# Patient Record
Sex: Female | Born: 2000 | Race: Black or African American | Hispanic: No | Marital: Single | Smoking: Never smoker
Health system: Southern US, Community
[De-identification: ages and names within clinical notes are randomized; demographics above are authoritative.]

## PROBLEM LIST (undated history)

## (undated) ENCOUNTER — Inpatient Hospital Stay (HOSPITAL_COMMUNITY): Payer: Self-pay

## (undated) DIAGNOSIS — T7840XA Allergy, unspecified, initial encounter: Secondary | ICD-10-CM

## (undated) DIAGNOSIS — Z634 Disappearance and death of family member: Secondary | ICD-10-CM

## (undated) DIAGNOSIS — J45909 Unspecified asthma, uncomplicated: Secondary | ICD-10-CM

## (undated) DIAGNOSIS — R7303 Prediabetes: Secondary | ICD-10-CM

## (undated) DIAGNOSIS — R42 Dizziness and giddiness: Secondary | ICD-10-CM

## (undated) DIAGNOSIS — E669 Obesity, unspecified: Secondary | ICD-10-CM

## (undated) DIAGNOSIS — K5900 Constipation, unspecified: Secondary | ICD-10-CM

## (undated) DIAGNOSIS — M419 Scoliosis, unspecified: Secondary | ICD-10-CM

## (undated) HISTORY — DX: Constipation, unspecified: K59.00

## (undated) HISTORY — DX: Disappearance and death of family member: Z63.4

## (undated) HISTORY — DX: Allergy, unspecified, initial encounter: T78.40XA

## (undated) HISTORY — DX: Obesity, unspecified: E66.9

## (undated) HISTORY — DX: Dizziness and giddiness: R42

## (undated) HISTORY — PX: NO PAST SURGERIES: SHX2092

---
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
## 2021-02-02 ENCOUNTER — Ambulatory Visit (INDEPENDENT_AMBULATORY_CARE_PROVIDER_SITE_OTHER): Payer: Medicaid Other | Admitting: Pediatrics

## 2021-02-02 DIAGNOSIS — Z87898 Personal history of other specified conditions: Secondary | ICD-10-CM | POA: Insufficient documentation

## 2021-02-02 DIAGNOSIS — E559 Vitamin D deficiency, unspecified: Secondary | ICD-10-CM

## 2021-02-02 HISTORY — DX: Vitamin D deficiency, unspecified: E55.9

## 2021-02-02 NOTE — Patient Instructions (Incomplete)

## 2021-02-02 NOTE — Progress Notes (Deleted)
Subjective:  Subjective  Patient Name: Lori Briggs Date of Birth: 06/30/2000  MRN: [id number]  Lori Briggs  presents for follow-up of obesity, insulin resistance, history of elevated HbA1c, and vitamin D deficiency.  HISTORY OF PRESENT ILLNESS:   Lori Briggs is a 21 y.o. female presenting for follow-up of the above concerns.  she attended this visit alone.***  1. Lori Briggs was initially referred to [facility] in 05/2014 for concerns of obesity and elevated A1c. Her A1c has ranged from 5.3% to 5.6% since.  There is a strong family history of T2DM in mother.    2. Since last visit on 06/30/20, she has been well***.  Weight has ***creased ***lb since last visit.  BMI now ***%.   A1c is ***% today (was 5.3% at last visit).   Diet changes: ***  Diet Review: Breakfast- *** Midmorning snack- *** Lunch- *** Afternoon snack- *** Dinner- *** Bedtime snack- *** Drinks ***  Activity: ***    Vitamin D deficiency: Taking Vit D supplementation ***5000 units daily Most recent vit D level 22 in 06/2020 Sun exposure: *** Milk/dairy consumption: ***  ROS: All systems reviewed with pertinent positives listed below; otherwise negative.   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 30 tablet 5   ergocalciferol (VITAMIN D2) 1.25 MG (50000 UT) capsule Take 1 capsule (50,000 Units total) by mouth once a week. 12 capsule 0   fluticasone (FLONASE) 50 MCG/ACT nasal spray Place 2 sprays into both nostrils daily. 16 g 1   SUMAtriptan (IMITREX) 50 MG  tablet Take 1 tablet (50 mg total) by mouth every 2 (two) hours as needed for migraine. May repeat in 2 hours if headache persists or recurs. Max of 2 tablets in a 24 hour period. 10 tablet 0   No current facility-administered medications on file prior to visit.   Allergies: Allergies  Allergen Reactions   Banana Swelling and Rash   Penicillins Rash   Hospitalizations/Surgeries: No past surgical history on file.  No recent surgeries or hospitalizations  Family History  Problem Relation Age of Onset   Diabetes Mother    Hyperlipidemia Mother    Diabetes Maternal Grandmother    Diabetes Maternal Grandfather    Heart disease Maternal Grandfather    Hypertension Maternal Grandfather    Diabetes Paternal Grandmother    Healthy Father    Anemia Sister    Brain cancer Brother        disseminated glioma in leptomeninges   Seizures Brother        had cystic lesions develop after age 54 , poss neurcystercosis-unconfirmed   Hydrocephalus Brother        acquired v/p shunt age 23   Diabetes Maternal Aunt    Social History: Completed 12th grade  Attends [employer] 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   Ht Readings from Last 3  Encounters:  08/09/20 5\' 1"  (1.549 m) (10 %, Z= -1.28)*  06/30/20 5' 1.02" (1.55 m) (10 %, Z= -1.27)*  12/10/19 5\' 1"  (1.549 m) (10 %, Z= -1.26)*   * Growth percentiles are based on CDC (Girls, 2-20 Years) data.   Wt Readings from Last 3 Encounters:  08/09/20 201 lb (91.2 kg) (98 %, Z= 1.98)*  06/30/20 202 lb 6.4 oz (91.8 kg) (98 %, Z= 2.00)*  02/25/20 195 lb 12.8 oz (88.8 kg) (97 %, Z= 1.92)*   * Growth percentiles are based on CDC (Girls, 2-20 Years) data.   There is no height or weight on file to calculate BSA. No height on file for this encounter. No weight on file for this encounter.   General: Well developed, well nourished ***female in no acute  distress.  Appears *** stated age Head: Normocephalic, atraumatic.   Eyes:  Pupils equal and round. EOMI.   Sclera white.  No eye drainage.   Ears/Nose/Mouth/Throat: Masked Neck: supple, no cervical lymphadenopathy, no thyromegaly Cardiovascular: regular rate, normal S1/S2, no murmurs Respiratory: No increased work of breathing.  Lungs clear to auscultation bilaterally.  No wheezes. Abdomen: soft, nontender, nondistended.  Extremities: warm, well perfused, cap refill < 2 sec.   Musculoskeletal: Normal muscle mass.  Normal strength Skin: warm, dry.  No rash or lesions. Neurologic: alert and oriented, normal speech, no tremor   LAB DATA:   Latest Reference Range & Units 06/30/20 15:55  Total CHOL/HDL Ratio <5.0 (calc) 3.3  Cholesterol <170 mg/dL 161184 (H)  HDL Cholesterol >45 mg/dL 56  LDL Cholesterol (Calc) <110 mg/dL (calc) 096114 (H)  Non-HDL Cholesterol (Calc) <120 mg/dL (calc) 045128 (H)  Triglycerides <90 mg/dL 56  Vitamin D, 40-[id number]-Hydroxy 30 - 100 ng/mL 22 (L)  TSH mIU/L 2.63  T4,Free(Direct) 0.8 - 1.4 ng/dL 0.9  (H): Data is abnormally high (L): Data is abnormally low  Results for orders placed or performed in visit on 06/30/20  TSH  Result Value Ref Range   TSH 2.63 mIU/L  T4, free  Result Value Ref Range   Free T4 0.9 0.8 - 1.4 ng/dL  VITAMIN D 25 Hydroxy (Vit-D Deficiency, Fractures)  Result Value Ref Range   Vit D, 25-Hydroxy 22 (L) 30 - 100 ng/mL  Lipid panel  Result Value Ref Range   Cholesterol 184 (H) <170 mg/dL   HDL 56 >78>45 mg/dL   Triglycerides 56 <29<90 mg/dL   LDL Cholesterol (Calc) 114 (H) <110 mg/dL (calc)   Total CHOL/HDL Ratio 3.3 <5.0 (calc)   Non-HDL Cholesterol (Calc) 128 (H) <120 mg/dL (calc)  POCT glycosylated hemoglobin (Hb A1C)  Result Value Ref Range   Hemoglobin A1C 5.3 4.0 - 5.6 %   HbA1c POC (<> result, manual entry)     HbA1c, POC (prediabetic range)     HbA1c, POC (controlled diabetic range)    POCT Glucose (Device for Home Use)  Result Value  Ref Range   Glucose Fasting, POC     POC Glucose 92 70 - 99 mg/dl   ASSESSMENT and PLAN:  Lori Briggs is a 21 y.o. female with history of elevated A1c in the setting of family history of T2DM.  A1c is ***today.  She has had ***weight gain since last visit and BMI is now above the curve.  She also has a history of vitamin D deficiency and is taking daily supplementation. ***  1. History of prediabetes 2. BMI 95-99th% -POC A1c and glucose as above -Growth chart reviewed with patient -  Encouraged healthy eating (cutting back on eating out).  Commended on only drinking SF drinks -Encouraged increased physical activity -Will draw TSH, FT4, lipid panel today.  3. Vitamin D deficiency -Will draw 25-OH D level today -Continue current vit D supplement pending labs  Follow-up: No follow-ups on file.  ***  Casimiro Needle, MD

## 2021-04-19 ENCOUNTER — Emergency Department (HOSPITAL_COMMUNITY)
Admission: EM | Admit: 2021-04-19 | Discharge: 2021-04-20 | Disposition: A | Discharge status: Home / Self Care | Payer: Medicaid Other | Attending: Emergency Medicine | Admitting: Emergency Medicine

## 2021-04-19 ENCOUNTER — Emergency Department (HOSPITAL_COMMUNITY): Payer: Medicaid Other

## 2021-04-19 ENCOUNTER — Encounter (HOSPITAL_COMMUNITY): Payer: Self-pay | Admitting: *Deleted

## 2021-04-19 DIAGNOSIS — R0789 Other chest pain: Secondary | ICD-10-CM | POA: Diagnosis not present

## 2021-04-19 DIAGNOSIS — G4489 Other headache syndrome: Secondary | ICD-10-CM | POA: Diagnosis not present

## 2021-04-19 DIAGNOSIS — R52 Pain, unspecified: Secondary | ICD-10-CM | POA: Diagnosis not present

## 2021-04-19 DIAGNOSIS — M79605 Pain in left leg: Secondary | ICD-10-CM | POA: Insufficient documentation

## 2021-04-19 DIAGNOSIS — Z041 Encounter for examination and observation following transport accident: Secondary | ICD-10-CM | POA: Diagnosis not present

## 2021-04-19 DIAGNOSIS — R519 Headache, unspecified: Secondary | ICD-10-CM | POA: Diagnosis not present

## 2021-04-19 DIAGNOSIS — M542 Cervicalgia: Secondary | ICD-10-CM | POA: Diagnosis not present

## 2021-04-19 DIAGNOSIS — R109 Unspecified abdominal pain: Secondary | ICD-10-CM | POA: Insufficient documentation

## 2021-04-19 DIAGNOSIS — R188 Other ascites: Secondary | ICD-10-CM | POA: Diagnosis not present

## 2021-04-19 DIAGNOSIS — M79604 Pain in right leg: Secondary | ICD-10-CM | POA: Insufficient documentation

## 2021-04-19 DIAGNOSIS — Z79899 Other long term (current) drug therapy: Secondary | ICD-10-CM | POA: Insufficient documentation

## 2021-04-19 DIAGNOSIS — Y92410 Unspecified street and highway as the place of occurrence of the external cause: Secondary | ICD-10-CM | POA: Insufficient documentation

## 2021-04-19 DIAGNOSIS — S3991XA Unspecified injury of abdomen, initial encounter: Secondary | ICD-10-CM | POA: Diagnosis not present

## 2021-04-19 LAB — I-STAT CHEM 8, ED
BUN: 11 mg/dL (ref 6–20)
Calcium, Ion: 1.19 mmol/L (ref 1.15–1.40)
Chloride: 104 mmol/L (ref 98–111)
Creatinine, Ser: 0.7 mg/dL (ref 0.44–1.00)
Glucose, Bld: 93 mg/dL (ref 70–99)
HCT: 38 % (ref 36.0–46.0)
Hemoglobin: 12.9 g/dL (ref 12.0–15.0)
Potassium: 3.4 mmol/L — ABNORMAL LOW (ref 3.5–5.1)
Sodium: 140 mmol/L (ref 135–145)
TCO2: 27 mmol/L (ref 22–32)

## 2021-04-19 LAB — I-STAT BETA HCG BLOOD, ED (MC, WL, AP ONLY): I-stat hCG, quantitative: <5 m[IU]/mL (ref <5)

## 2021-04-19 LAB — URINALYSIS, ROUTINE W REFLEX MICROSCOPIC
Bacteria, UA: NONE SEEN
Bilirubin Urine: NEGATIVE
Glucose, UA: NEGATIVE mg/dL
Ketones, ur: NEGATIVE mg/dL
Leukocytes,Ua: NEGATIVE
Nitrite: NEGATIVE
Protein, ur: NEGATIVE mg/dL
Specific Gravity, Urine: 1.01 (ref 1.005–1.030)
pH: 6 (ref 5.0–8.0)

## 2021-04-19 MED ORDER — IOHEXOL 300 MG/ML  SOLN
100.0000 mL | Freq: Once | INTRAMUSCULAR | Status: AC | PRN
  Administered 2021-04-19: 80 mL via INTRAVENOUS

## 2021-04-19 MED ORDER — IBUPROFEN 400 MG PO TABS
600.0000 mg | ORAL_TABLET | Freq: Once | ORAL | Status: AC
  Administered 2021-04-19: 600 mg via ORAL
  Filled 2021-04-19: qty 2
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
## 2021-04-19 NOTE — ED Notes (Signed)
Patient transported to CT 
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
## 2021-04-20 MED ORDER — NAPROXEN 375 MG PO TABS: 375.0000 mg | ORAL_TABLET | Freq: Two times a day (BID) | ORAL | 0 refills | Status: DC

## 2021-04-20 MED ORDER — CYCLOBENZAPRINE HCL 10 MG PO TABS: 10.0000 mg | ORAL_TABLET | Freq: Two times a day (BID) | ORAL | 0 refills | Status: DC | PRN
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
## 2021-04-21 ENCOUNTER — Telehealth: Payer: Self-pay

## 2021-04-21 NOTE — Telephone Encounter (Signed)
Transition Care Management Follow-up Telephone Call ?Date of discharge and from where: 04/20/2021-[place]  ?How have you been since you were released from the hospital? Pt stated she is still sore but she is ok.  ?Any questions or concerns? No ? ?Items Reviewed: ?Did the pt receive and understand the discharge instructions provided? Yes  ?Medications obtained and verified? Yes  ?Other? No  ?Any new allergies since your discharge? No  ?Dietary orders reviewed? No ?Do you have support at home? Yes  ? ?Home Care and Equipment/Supplies: ?Were home health services ordered? not applicable ?If so, what is the name of the agency? N/A  ?Has the agency set up a time to come to the patient's home? not applicable ?Were any new equipment or medical supplies ordered?  No ?What is the name of the medical supply agency? N/A ?Were you able to get the supplies/equipment? not applicable ?Do you have any questions related to the use of the equipment or supplies? No ? ?Functional Questionnaire: (I = Independent and D = Dependent) ?ADLs: I ? ?Bathing/Dressing- I ? ?Meal Prep- I ? ?Eating- I ? ?Maintaining continence- I ? ?Transferring/Ambulation- I ? ?Managing Meds- I ? ?Follow up appointments reviewed: ? ?PCP Hospital f/u appt confirmed? Yes  Scheduled to see PCP Office on 04/27/2021. ?Specialist Hospital f/u appt confirmed? No   ?Are transportation arrangements needed? No  ?If their condition worsens, is the pt aware to call PCP or go to the Emergency Dept.? Yes ?Was the patient provided with contact information for the PCP's office or ED? Yes ?Was to pt encouraged to call back with questions or concerns? Yes  ?

## 2021-04-27 ENCOUNTER — Encounter: Payer: Self-pay | Admitting: Family Medicine

## 2021-04-27 ENCOUNTER — Ambulatory Visit (INDEPENDENT_AMBULATORY_CARE_PROVIDER_SITE_OTHER): Payer: Medicaid Other | Admitting: Family Medicine

## 2021-04-27 DIAGNOSIS — R42 Dizziness and giddiness: Secondary | ICD-10-CM | POA: Diagnosis not present

## 2021-04-27 DIAGNOSIS — E161 Other hypoglycemia: Secondary | ICD-10-CM

## 2021-04-27 DIAGNOSIS — N3090 Cystitis, unspecified without hematuria: Secondary | ICD-10-CM

## 2021-04-27 DIAGNOSIS — Z1159 Encounter for screening for other viral diseases: Secondary | ICD-10-CM

## 2021-04-27 DIAGNOSIS — R7301 Impaired fasting glucose: Secondary | ICD-10-CM | POA: Diagnosis not present

## 2021-04-27 DIAGNOSIS — E559 Vitamin D deficiency, unspecified: Secondary | ICD-10-CM | POA: Diagnosis not present

## 2021-04-27 DIAGNOSIS — R7303 Prediabetes: Secondary | ICD-10-CM

## 2021-04-27 DIAGNOSIS — Z118 Encounter for screening for other infectious and parasitic diseases: Secondary | ICD-10-CM | POA: Diagnosis not present

## 2021-04-27 HISTORY — DX: Dizziness and giddiness: R42

## 2021-04-27 MED ORDER — MECLIZINE HCL 25 MG PO TABS
25.0000 mg | ORAL_TABLET | Freq: Three times a day (TID) | ORAL | 0 refills | Status: DC | PRN
Start: 2021-04-27 — End: 2021-10-28

## 2021-04-27 NOTE — Assessment & Plan Note (Addendum)
-   Labs and imaging reviewed ?-Continue taking flexeril and Naproxen for pain and muscle spasm ?- Encouraged to increase fiber and fluid intake for constipation ?-Recommended OTC miralax if needed ?
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
## 2021-04-27 NOTE — Patient Instructions (Addendum)
I appreciate the opportunity to provide care to you today! ?  ?-Please stop by the lab to have your labs drawn anytime this week. ? ?-I have sent you a prescription for meclizine to help with your dizziness. ? ?-Continue taking Flexeril and naproxen for pain and muscle spasms for your neck and back pain. ? ? ? ? ?  ?It was a pleasure to see you and I look forward to continuing to work together on your health and well-being. ?Please do not hesitate to call the office if you need care or have questions about your care. ?  ?Have a wonderful day and week. ?With Gratitude, ?Gilmore Laroche MSN, FNP-BC ? ?

## 2021-04-27 NOTE — Assessment & Plan Note (Signed)
-   Informed patient to take meclizine for positional dizziness ?

## 2021-04-27 NOTE — Assessment & Plan Note (Addendum)
Lab Results  ?Component Value Date  ? HGBA1C 5.3 06/30/2020  ? ?-Pending results for HgA1c today ?

## 2021-04-29 LAB — CHLAMYDIA/GC NAA, CONFIRMATION
Chlamydia trachomatis, NAA: NEGATIVE
Neisseria gonorrhoeae, NAA: NEGATIVE

## 2021-04-29 NOTE — Progress Notes (Signed)
Please inform the patient her results were negative for chlamydia and gonorrhea

## 2021-05-12 ENCOUNTER — Encounter: Payer: Self-pay | Admitting: *Deleted

## 2021-05-20 DIAGNOSIS — E161 Other hypoglycemia: Secondary | ICD-10-CM | POA: Diagnosis not present

## 2021-05-20 DIAGNOSIS — E559 Vitamin D deficiency, unspecified: Secondary | ICD-10-CM | POA: Diagnosis not present

## 2021-05-20 DIAGNOSIS — Z1159 Encounter for screening for other viral diseases: Secondary | ICD-10-CM | POA: Diagnosis not present

## 2021-05-22 LAB — CBC WITH DIFFERENTIAL/PLATELET
Basophils Absolute: 0 10*3/uL (ref 0.0–0.2)
Basos: 1 %
EOS (ABSOLUTE): 0 10*3/uL (ref 0.0–0.4)
Eos: 1 %
Hematocrit: 36.2 % (ref 34.0–46.6)
Hemoglobin: 12.1 g/dL (ref 11.1–15.9)
Immature Grans (Abs): 0 10*3/uL (ref 0.0–0.1)
Immature Granulocytes: 0 %
Lymphocytes Absolute: 1.4 10*3/uL (ref 0.7–3.1)
Lymphs: 36 %
MCH: 28.3 pg (ref 26.6–33.0)
MCHC: 33.4 g/dL (ref 31.5–35.7)
MCV: 85 fL (ref 79–97)
Monocytes Absolute: 0.3 10*3/uL (ref 0.1–0.9)
Monocytes: 7 %
Neutrophils Absolute: 2.1 10*3/uL (ref 1.4–7.0)
Neutrophils: 55 %
Platelets: 270 10*3/uL (ref 150–450)
RBC: 4.28 x10E6/uL (ref 3.77–5.28)
RDW: 14 % (ref 11.7–15.4)
WBC: 3.8 10*3/uL (ref 3.4–10.8)

## 2021-05-22 LAB — HEMOGLOBIN A1C
Est. average glucose Bld gHb Est-mCnc: 111 mg/dL
Hgb A1c MFr Bld: 5.5 % (ref 4.8–5.6)

## 2021-05-22 LAB — CMP14+EGFR
ALT: 13 IU/L (ref 0–32)
AST: 14 IU/L (ref 0–40)
Albumin/Globulin Ratio: 2.3 — ABNORMAL HIGH (ref 1.2–2.2)
Albumin: 4.5 g/dL (ref 3.9–5.0)
Alkaline Phosphatase: 67 IU/L (ref 42–106)
BUN/Creatinine Ratio: 14 (ref 9–23)
BUN: 10 mg/dL (ref 6–20)
Bilirubin Total: 0.3 mg/dL (ref 0.0–1.2)
CO2: 24 mmol/L (ref 20–29)
Calcium: 9.4 mg/dL (ref 8.7–10.2)
Chloride: 102 mmol/L (ref 96–106)
Creatinine, Ser: 0.69 mg/dL (ref 0.57–1.00)
Globulin, Total: 2 g/dL (ref 1.5–4.5)
Glucose: 76 mg/dL (ref 70–99)
Potassium: 4.1 mmol/L (ref 3.5–5.2)
Sodium: 138 mmol/L (ref 134–144)
Total Protein: 6.5 g/dL (ref 6.0–8.5)
eGFR: 128 mL/min/{1.73_m2} (ref >59)

## 2021-05-22 LAB — LIPID PANEL
Chol/HDL Ratio: 3.5 ratio (ref 0.0–4.4)
Cholesterol, Total: 180 mg/dL — ABNORMAL HIGH (ref 100–169)
HDL: 52 mg/dL (ref >39)
LDL Chol Calc (NIH): 120 mg/dL — ABNORMAL HIGH (ref 0–109)
Triglycerides: 41 mg/dL (ref 0–89)
VLDL Cholesterol Cal: 8 mg/dL (ref 5–40)

## 2021-05-22 LAB — HEPATITIS C ANTIBODY: Hep C Virus Ab: NONREACTIVE

## 2021-05-22 LAB — VITAMIN D 25 HYDROXY (VIT D DEFICIENCY, FRACTURES): Vit D, 25-Hydroxy: 13 ng/mL — ABNORMAL LOW (ref 30.0–100.0)

## 2021-05-22 LAB — TSH+FREE T4
Free T4: 1.09 ng/dL (ref 0.93–1.60)
TSH: 1.89 u[IU]/mL (ref 0.450–4.500)

## 2021-05-22 LAB — MICROALBUMIN / CREATININE URINE RATIO
Creatinine, Urine: 166.2 mg/dL
Microalb/Creat Ratio: 5 mg/g creat (ref 0–29)
Microalbumin, Urine: 8 ug/mL

## 2021-05-23 ENCOUNTER — Other Ambulatory Visit: Payer: Self-pay | Admitting: Family Medicine

## 2021-05-23 DIAGNOSIS — E559 Vitamin D deficiency, unspecified: Secondary | ICD-10-CM

## 2021-05-23 MED ORDER — ERGOCALCIFEROL 1.25 MG (50000 UT) PO CAPS: 50000.0000 [IU] | ORAL_CAPSULE | ORAL | 0 refills | Status: DC

## 2021-05-23 NOTE — Progress Notes (Signed)
Advised the patient to take vit D supplements once weekly because her vit D is low. The order is placed and sent to her pharmacy.  ? ?I also recommend a cholesterol-lowering diet low in fat or saturated fat because her cholesterol were slightly elevated. I would advise her to implement the Mediterranean diet, which emphasizes fruits, vegetables, whole grains, beans, nuts, seeds, and healthy fats. ? ?All other labs are within normal limits ?

## 2021-07-22 ENCOUNTER — Ambulatory Visit (INDEPENDENT_AMBULATORY_CARE_PROVIDER_SITE_OTHER): Payer: Medicaid Other | Admitting: Family Medicine

## 2021-07-22 ENCOUNTER — Encounter: Payer: Self-pay | Admitting: Family Medicine

## 2021-07-22 VITALS — BP 105/61 | HR 74 | Ht 61.0 in | Wt 196.6 lb

## 2021-07-22 DIAGNOSIS — G8929 Other chronic pain: Secondary | ICD-10-CM

## 2021-07-22 DIAGNOSIS — R42 Dizziness and giddiness: Secondary | ICD-10-CM | POA: Diagnosis not present

## 2021-07-22 DIAGNOSIS — M545 Low back pain, unspecified: Secondary | ICD-10-CM | POA: Diagnosis not present

## 2021-07-22 HISTORY — DX: Low back pain, unspecified: M54.50

## 2021-07-22 NOTE — Assessment & Plan Note (Signed)
Resolved No complaints were noted today

## 2021-07-22 NOTE — Patient Instructions (Addendum)
I appreciate the opportunity to provide care to you today!    Follow up:  3 months CPE   Tiger balm  to apply to the lower back   Please continue to a heart-healthy diet and increase your physical activities. Try to exercise for at least three times a week.      It was a pleasure to see you and I look forward to continuing to work together on your health and well-being. Please do not hesitate to call the office if you need care or have questions about your care.   Have a wonderful day and week. With Gratitude, Gilmore Laroche MSN, FNP-BC
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
## 2021-07-27 ENCOUNTER — Ambulatory Visit: Payer: Medicaid Other | Admitting: Family Medicine

## 2021-08-10 ENCOUNTER — Other Ambulatory Visit: Payer: Self-pay | Admitting: Family Medicine

## 2021-08-10 DIAGNOSIS — E559 Vitamin D deficiency, unspecified: Secondary | ICD-10-CM

## 2021-10-28 ENCOUNTER — Encounter: Payer: Self-pay | Admitting: Family Medicine

## 2021-10-28 ENCOUNTER — Ambulatory Visit (INDEPENDENT_AMBULATORY_CARE_PROVIDER_SITE_OTHER): Payer: Medicaid Other | Admitting: Family Medicine

## 2021-10-28 VITALS — BP 114/71 | HR 71 | Resp 16 | Ht 61.0 in | Wt 188.1 lb

## 2021-10-28 DIAGNOSIS — E559 Vitamin D deficiency, unspecified: Secondary | ICD-10-CM | POA: Diagnosis not present

## 2021-10-28 DIAGNOSIS — Z0001 Encounter for general adult medical examination with abnormal findings: Secondary | ICD-10-CM

## 2021-10-28 DIAGNOSIS — E038 Other specified hypothyroidism: Secondary | ICD-10-CM | POA: Diagnosis not present

## 2021-10-28 DIAGNOSIS — J301 Allergic rhinitis due to pollen: Secondary | ICD-10-CM

## 2021-10-28 DIAGNOSIS — R7301 Impaired fasting glucose: Secondary | ICD-10-CM | POA: Diagnosis not present

## 2021-10-28 DIAGNOSIS — Z114 Encounter for screening for human immunodeficiency virus [HIV]: Secondary | ICD-10-CM | POA: Diagnosis not present

## 2021-10-28 DIAGNOSIS — E7849 Other hyperlipidemia: Secondary | ICD-10-CM | POA: Diagnosis not present

## 2021-10-28 MED ORDER — FLUTICASONE PROPIONATE 50 MCG/ACT NA SUSP: 2.0000 | Freq: Every day | NASAL | 2 refills | Status: DC

## 2021-10-28 MED ORDER — CETIRIZINE HCL 10 MG PO TABS: 10.0000 mg | ORAL_TABLET | Freq: Every day | ORAL | 5 refills | Status: DC
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
## 2021-10-28 NOTE — Assessment & Plan Note (Addendum)
Physical exam as documented Counseling is done on healthy lifestyle involving commitment to 150 minutes of exercise per week,  Discussed heart-healthy diet Immunization and cancer screening needs are specifically addressed at this visit She declines a flu vaccination today Will get basic labs today

## 2021-10-28 NOTE — Patient Instructions (Signed)
I appreciate the opportunity to provide care to you today!    Follow up:  6 months  Labs: please stop by the lab during the week to get your blood drawn (CBC, CMP, TSH, Lipid profile, HgA1c, Vit D)  Please pick up your medications at the pharmacy  Please continue to a heart-healthy diet and increase your physical activities. Try to exercise for 56mins at least three times a week.      It was a pleasure to see you and I look forward to continuing to work together on your health and well-being. Please do not hesitate to call the office if you need care or have questions about your care.   Have a wonderful day and week. With Gratitude, Alvira Monday MSN, FNP-BC

## 2021-10-28 NOTE — Assessment & Plan Note (Signed)
Refilled Flonase and Zyrtec. 

## 2021-11-04 NOTE — Addendum Note (Signed)
Addended byAlvira Monday on: 11/04/2021 05:29 PM   Modules accepted: Level of Service

## 2022-03-10 DIAGNOSIS — H5213 Myopia, bilateral: Secondary | ICD-10-CM | POA: Diagnosis not present

## 2022-03-20 ENCOUNTER — Ambulatory Visit (INDEPENDENT_AMBULATORY_CARE_PROVIDER_SITE_OTHER): Payer: Medicaid Other | Admitting: Family Medicine

## 2022-03-20 ENCOUNTER — Encounter: Payer: Self-pay | Admitting: Family Medicine

## 2022-03-20 VITALS — BP 133/88 | HR 71 | Ht 61.0 in | Wt 178.0 lb

## 2022-03-20 DIAGNOSIS — N920 Excessive and frequent menstruation with regular cycle: Secondary | ICD-10-CM

## 2022-03-20 DIAGNOSIS — G43829 Menstrual migraine, not intractable, without status migrainosus: Secondary | ICD-10-CM

## 2022-03-20 MED ORDER — NORETHINDRONE 0.35 MG PO TABS: 1.0000 | ORAL_TABLET | Freq: Every day | ORAL | 1 refills | Status: DC

## 2022-03-20 MED ORDER — SUMATRIPTAN SUCCINATE 25 MG PO TABS: 25.0000 mg | ORAL_TABLET | ORAL | 0 refills | Status: DC | PRN

## 2022-03-20 NOTE — Patient Instructions (Addendum)
I appreciate the opportunity to provide care to you today!    Follow up:  the end of April 2024  Labs:  (CBC)  Menstrual Migraine Will start you on sumatriptan 25 mg to take as needed for migraine headaches Your migraine is likely associated with alterations in your estrogen levels during your menstrual cycle. Will reevaluate your symptoms at the end of the month in April If your symptoms are persistent, will will consider starting you on a combined oral contraceptive     Please continue to a heart-healthy diet and increase your physical activities. Try to exercise for 5mns at least five times a week.      It was a pleasure to see you and I look forward to continuing to work together on your health and well-being. Please do not hesitate to call the office if you need care or have questions about your care.   Have a wonderful day and week. With Gratitude, GAlvira MondayMSN, FNP-BC

## 2022-03-20 NOTE — Progress Notes (Signed)
Established Patient Office Visit  Subjective:  Patient ID: Lori Briggs, female    DOB: 2000-07-29  Age: 22 y.o. MRN: [id number]  CC:  Chief Complaint  Patient presents with   Menstrual Problem    Pt reports heavy flow with menstrual cycles since 09/09/2021, has migraines that are severe cause her to vomit. Lmp was 03/12/22, causing her to miss work due to heavy flow and mitraine sx.     HPI Lori Briggs is a 22 y.o. female with past medical history of menstrual migraines, dizziness presents for f/u of  chronic medical conditions. For the details of today's visit, please refer to the assessment and plan.     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 No pertinent surgical history.  Family History  Problem Relation Age of Onset   Diabetes Mother    Hyperlipidemia Mother    Diabetes Maternal Grandmother    Diabetes Maternal Grandfather    Heart disease Maternal Grandfather    Hypertension Maternal Grandfather    Diabetes Paternal Grandmother    Healthy Father    Anemia Sister    Brain cancer Brother        disseminated glioma in leptomeninges   Seizures Brother        had cystic lesions develop after age 381 , poss neurcystercosis-unconfirmed   Hydrocephalus Brother        acquired v/p shunt age 79   Diabetes Maternal Aunt     Social History   Socioeconomic History   Marital status: Single    Spouse name: Not on file   Number of children: Not on file   Years of education: Not on file   Highest education level: Not on file  Occupational History   Occupation: [employer]    Comment: Actuary- trains all positions  Tobacco Use   Smoking status: Never   Smokeless tobacco: Never  Substance and Sexual  Activity   Alcohol use: No   Drug use: No   Sexual activity: Yes    Birth control/protection: Condom  Other Topics Concern   Not on file  Social History Narrative   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 30 tablet 5   fluticasone (FLONASE) 50 MCG/ACT nasal spray Place 2 sprays into both nostrils daily. 16 g 2   Vitamin D, Ergocalciferol, (DRISDOL) 1.25 MG (50000 UNIT) CAPS capsule Take 1 capsule by mouth once a week 12 capsule 0   No facility-administered medications prior to visit.    Allergies  Allergen Reactions   Banana Swelling and Rash   Penicillins Rash    ROS Review of Systems  Constitutional:  Negative  for chills and fever.  Eyes:  Negative for visual disturbance.  Respiratory:  Negative for chest tightness and shortness of breath.   Genitourinary:  Positive for menstrual problem.  Neurological:  Positive for headaches. Negative for dizziness.      Objective:    Physical Exam HENT:     Head: Normocephalic.     Mouth/Throat:     Mouth: Mucous membranes are moist.  Cardiovascular:     Rate and Rhythm: Normal rate.     Heart sounds: Normal heart sounds.  Pulmonary:     Effort: Pulmonary effort is normal.     Breath sounds: Normal breath sounds.  Neurological:     Mental Status: She is alert.     BP 133/88   Pulse 71   Ht '5\' 1"'$  (1.549 m)   Wt 178 lb (80.7 kg)   SpO2 98%   BMI 33.63 kg/m  Wt Readings from Last 3 Encounters:  03/20/22 178 lb (80.7 kg)  10/28/21 188 lb 1.9 oz (85.3 kg) (96 %, Z=  1.74)*  07/22/21 196 lb 9.6 oz (89.2 kg) (97 %, Z= 1.89)*   * Growth percentiles are based on CDC (Girls, 2-20 Years) data.    Lab Results  Component Value Date   TSH 1.890 05/20/2021   Lab Results  Component Value Date   WBC 3.8 05/20/2021   HGB 12.1 05/20/2021   HCT 36.2 05/20/2021   MCV 85 05/20/2021   PLT 270 05/20/2021   Lab Results  Component Value Date   NA 138 05/20/2021   K 4.1 05/20/2021   CO2 24 05/20/2021   GLUCOSE 76 05/20/2021   BUN 10 05/20/2021   CREATININE 0.69 05/20/2021   BILITOT 0.3 05/20/2021   ALKPHOS 67 05/20/2021   AST 14 05/20/2021   ALT 13 05/20/2021   PROT 6.5 05/20/2021   ALBUMIN 4.5 05/20/2021   CALCIUM 9.4 05/20/2021   ANIONGAP 6 07/17/2017   EGFR 128 05/20/2021   Lab Results  Component Value Date   CHOL 180 (H) 05/20/2021   Lab Results  Component Value Date   HDL 52 05/20/2021   Lab Results  Component Value Date   LDLCALC 120 (H) 05/20/2021   Lab Results  Component Value Date   TRIG 41 05/20/2021   Lab Results  Component Value Date   CHOLHDL 3.5 05/20/2021   Lab Results  Component Value Date   HGBA1C 5.5 05/20/2021      Assessment & Plan:  Menstrual migraine without status migrainosus, not intractable Assessment & Plan: Recurrent condition She was seen for similar complaints on 08/09/2020 and was started on Triptans for her menstrual headaches Patient reports that she has not been taking triptans She complains of migraine headaches that occur during her menstrual cycles She reports heavy menstrual bleeding, noting to change her pads every 2 hours She complains of severe migraine headaches for the duration of her menstrual cycle Last menstrual period was 03/12/2022 Will get a transvaginal ultrasound to rule out fibroids Will reinstate triptan therapy today If triptan is ineffective, we will consider starting a patient on oral combined contraceptives   Orders: -     SUMAtriptan Succinate; Take 1 tablet (25 mg  total) by mouth every 2 (two) hours as needed for migraine. Max: 200 mg PO daily  Dispense: 10 tablet; Refill: 0  Menorrhagia with regular cycle -     US PELVIC COMPLETE WITH TRANSVAGINAL -     CBC    Follow-up: Return in about 7  weeks (around 05/05/2022).   Alvira Monday, FNP

## 2022-03-20 NOTE — Assessment & Plan Note (Signed)
Recurrent condition She was seen for similar complaints on 08/09/2020 and was started on Triptans for her menstrual headaches Patient reports that she has not been taking triptans She complains of migraine headaches that occur during her menstrual cycles She reports heavy menstrual bleeding, noting to change her pads every 2 hours She complains of severe migraine headaches for the duration of her menstrual cycle Last menstrual period was 03/12/2022 Will get a transvaginal ultrasound to rule out fibroids Will reinstate triptan therapy today If triptan is ineffective, we will consider starting a patient on oral combined contraceptives

## 2022-03-21 LAB — CBC
Hematocrit: 36.2 % (ref 34.0–46.6)
Hemoglobin: 12 g/dL (ref 11.1–15.9)
MCH: 27.6 pg (ref 26.6–33.0)
MCHC: 33.1 g/dL (ref 31.5–35.7)
MCV: 83 fL (ref 79–97)
Platelets: 238 10*3/uL (ref 150–450)
RBC: 4.35 x10E6/uL (ref 3.77–5.28)
RDW: 12.9 % (ref 11.7–15.4)
WBC: 4.1 10*3/uL (ref 3.4–10.8)

## 2022-03-27 ENCOUNTER — Ambulatory Visit (HOSPITAL_COMMUNITY)
Admission: RE | Admit: 2022-03-27 | Discharge: 2022-03-27 | Disposition: A | Discharge status: Home / Self Care | Payer: Medicaid Other | Source: Ambulatory Visit | Attending: Family Medicine | Admitting: Family Medicine

## 2022-03-27 DIAGNOSIS — N920 Excessive and frequent menstruation with regular cycle: Secondary | ICD-10-CM | POA: Insufficient documentation

## 2022-03-29 ENCOUNTER — Telehealth: Payer: Self-pay | Admitting: Family Medicine

## 2022-03-29 NOTE — Telephone Encounter (Signed)
Patient calling about ultrasound results. Seen in mychart and needs it explained.

## 2022-03-31 ENCOUNTER — Telehealth: Payer: Self-pay | Admitting: Family Medicine

## 2022-03-31 ENCOUNTER — Other Ambulatory Visit: Payer: Self-pay | Admitting: Family Medicine

## 2022-03-31 DIAGNOSIS — N83201 Unspecified ovarian cyst, right side: Secondary | ICD-10-CM

## 2022-03-31 NOTE — Telephone Encounter (Signed)
Called and reviewed results with the patient

## 2022-03-31 NOTE — Telephone Encounter (Signed)
I called and reviewed the result of the pelvic ultrasound. I informed the patient that no fibroids or masses were noted. She does have a hemorrhagic cyst in her right ovary, with the recommendation to follow up with an ultrasound in 6 to 12 weeks to assess this resolution. Orders are placed for pelvic u/s in 6 weeks

## 2022-05-04 ENCOUNTER — Ambulatory Visit (HOSPITAL_COMMUNITY)
Admission: RE | Admit: 2022-05-04 | Discharge: 2022-05-04 | Disposition: A | Discharge status: Home / Self Care | Payer: Medicaid Other | Source: Ambulatory Visit | Attending: Family Medicine | Admitting: Family Medicine

## 2022-05-04 DIAGNOSIS — N83201 Unspecified ovarian cyst, right side: Secondary | ICD-10-CM | POA: Insufficient documentation

## 2022-05-05 ENCOUNTER — Ambulatory Visit (INDEPENDENT_AMBULATORY_CARE_PROVIDER_SITE_OTHER): Payer: Medicaid Other | Admitting: Family Medicine

## 2022-05-05 ENCOUNTER — Encounter: Payer: Self-pay | Admitting: Family Medicine

## 2022-05-05 VITALS — BP 124/85 | HR 85 | Ht 60.5 in | Wt 170.1 lb

## 2022-05-05 DIAGNOSIS — E7849 Other hyperlipidemia: Secondary | ICD-10-CM

## 2022-05-05 DIAGNOSIS — Z114 Encounter for screening for human immunodeficiency virus [HIV]: Secondary | ICD-10-CM

## 2022-05-05 DIAGNOSIS — R7301 Impaired fasting glucose: Secondary | ICD-10-CM

## 2022-05-05 DIAGNOSIS — E038 Other specified hypothyroidism: Secondary | ICD-10-CM

## 2022-05-05 DIAGNOSIS — Z118 Encounter for screening for other infectious and parasitic diseases: Secondary | ICD-10-CM

## 2022-05-05 DIAGNOSIS — E559 Vitamin D deficiency, unspecified: Secondary | ICD-10-CM | POA: Diagnosis not present

## 2022-05-05 DIAGNOSIS — J301 Allergic rhinitis due to pollen: Secondary | ICD-10-CM | POA: Diagnosis not present

## 2022-05-05 MED ORDER — VITAMIN D (ERGOCALCIFEROL) 1.25 MG (50000 UNIT) PO CAPS: 50000.0000 [IU] | ORAL_CAPSULE | ORAL | 0 refills | Status: DC

## 2022-05-05 MED ORDER — CETIRIZINE HCL 10 MG PO TABS
10.0000 mg | ORAL_TABLET | Freq: Every day | ORAL | 5 refills | Status: AC
Start: 2022-05-05 — End: ?

## 2022-05-05 MED ORDER — FLUTICASONE PROPIONATE 50 MCG/ACT NA SUSP
2.0000 | Freq: Every day | NASAL | 2 refills | Status: AC
Start: 2022-05-05 — End: ?

## 2022-05-05 NOTE — Assessment & Plan Note (Signed)
Reports compliance with her weekly vitamin D supplement Refill sent to the pharmacy

## 2022-05-05 NOTE — Progress Notes (Signed)
Established Patient Office Visit  Subjective:  Patient ID: Lori Briggs, female    DOB: 2000/08/23  Age: 22 y.o. MRN: [id number]  CC:  Chief Complaint  Patient presents with   Chronic Care Management    Pt following up for 6 month visit.     HPI Lori Briggs is a 21 y.o. female with past medical history of allergic rhinitis, and vitamin D deficiency presents for f/u of  chronic medical conditions. For the details of today's visit, please refer to the assessment and plan.     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 No pertinent surgical history.  Family History  Problem Relation Age of Onset   Diabetes Mother    Hyperlipidemia Mother    Diabetes Maternal Grandmother    Diabetes Maternal Grandfather    Heart disease Maternal Grandfather    Hypertension Maternal Grandfather    Diabetes Paternal Grandmother    Healthy Father    Anemia Sister    Brain cancer Brother        disseminated glioma in leptomeninges   Seizures Brother        had cystic lesions develop after age 61 , poss neurcystercosis-unconfirmed   Hydrocephalus Brother        acquired v/p shunt age 63   Diabetes Maternal Aunt     Social History   Socioeconomic History   Marital status: Single    Spouse name: Not on file   Number of children: Not on file   Years of education: Not on file   Highest education level: Not on file  Occupational History   Occupation: [employer]    Comment: Astronomer- trains all positions  Tobacco Use   Smoking status: Never   Smokeless tobacco: Never  Substance and Sexual Activity   Alcohol use: No   Drug use: No   Sexual activity: Yes    Birth control/protection: Condom  Other Topics Concern   Not on  file  Social History Narrative   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 Max: 200 mg PO daily 10 tablet 0   cetirizine (ZYRTEC) 10 MG tablet Take 1 tablet (10 mg total) by mouth daily. 30 tablet 5   fluticasone (FLONASE) 50 MCG/ACT nasal spray Place 2 sprays into both nostrils daily. 16 g 2   Vitamin D, Ergocalciferol, (DRISDOL) 1.25 MG (50000 UNIT) CAPS capsule Take 1 capsule by mouth once a week 12 capsule 0   No facility-administered medications prior to visit.    Allergies  Allergen Reactions   Banana Swelling and Rash   Penicillins Rash  ROS Review of Systems  Constitutional:  Negative for chills and fever.  Eyes:  Negative for visual disturbance.  Respiratory:  Negative for chest tightness and shortness of breath.   Neurological:  Negative for dizziness and headaches.      Objective:    Physical Exam HENT:     Head: Normocephalic.     Mouth/Throat:     Mouth: Mucous membranes are moist.  Cardiovascular:     Rate and Rhythm: Normal rate.     Heart sounds: Normal heart sounds.  Pulmonary:     Effort: Pulmonary effort is normal.     Breath sounds: Normal breath sounds.  Neurological:     Mental Status: She is alert.     BP 124/85   Pulse 85   Ht 5' 0.5" (1.537 m)   Wt 170 lb 1.9 oz (77.2 kg)   SpO2 97%   BMI 32.68 kg/m  Wt Readings from Last 3 Encounters:  05/05/22 170 lb 1.9 oz (77.2 kg)  03/20/22 178 lb (80.7 kg)  10/28/21 188 lb 1.9 oz (85.3 kg) (96 %, Z= 1.74)*    * Growth percentiles are based on CDC (Girls, 2-20 Years) data.    Lab Results  Component Value Date   TSH 1.890 05/20/2021   Lab Results  Component Value Date   WBC 4.1 03/20/2022   HGB 12.0 03/20/2022   HCT 36.2 03/20/2022   MCV 83 03/20/2022   PLT 238 03/20/2022   Lab Results  Component Value Date   NA 138 05/20/2021   K 4.1 05/20/2021   CO2 24 05/20/2021   GLUCOSE 76 05/20/2021   BUN 10 05/20/2021   CREATININE 0.69 05/20/2021   BILITOT 0.3 05/20/2021   ALKPHOS 67 05/20/2021   AST 14 05/20/2021   ALT 13 05/20/2021   PROT 6.5 05/20/2021   ALBUMIN 4.5 05/20/2021   CALCIUM 9.4 05/20/2021   ANIONGAP 6 07/17/2017   EGFR 128 05/20/2021   Lab Results  Component Value Date   CHOL 180 (H) 05/20/2021   Lab Results  Component Value Date   HDL 52 05/20/2021   Lab Results  Component Value Date   LDLCALC 120 (H) 05/20/2021   Lab Results  Component Value Date   TRIG 41 05/20/2021   Lab Results  Component Value Date   CHOLHDL 3.5 05/20/2021   Lab Results  Component Value Date   HGBA1C 5.5 05/20/2021      Assessment & Plan:  Seasonal allergic rhinitis due to pollen Assessment & Plan: Refilled Zyrtec 10 mg daily and Flonase nasal spray  Orders: -     Cetirizine HCl; Take 1 tablet (10 mg total) by mouth daily.  Dispense: 30 tablet; Refill: 5 -     Fluticasone Propionate; Place 2 sprays into both nostrils daily.  Dispense: 16 g; Refill: 2  Vitamin D deficiency Assessment & Plan: Reports compliance with her weekly vitamin D supplement Refill sent to the pharmacy  Orders: -     Vitamin D (Ergocalciferol); Take 1 capsule (50,000 Units total) by mouth once a week.  Dispense: 20 capsule; Refill: 0 -     VITAMIN D 25 Hydroxy (Vit-D Deficiency, Fractures)  Screening for chlamydial disease -     Chlamydia trachomatis, DNA, amp probe  IFG (impaired fasting glucose) -     Hemoglobin A1c  Other specified hypothyroidism -     TSH + free T4  Other  hyperlipidemia -     Lipid panel -  CMP14+EGFR -     CBC with Differential/Platelet  Screening for HIV (human immunodeficiency virus) -     HIV Antibody (routine testing w rflx)    Follow-up: Return in about 6 months (around 11/04/2022).   Gilmore Laroche, FNP

## 2022-05-05 NOTE — Patient Instructions (Signed)
I appreciate the opportunity to provide care to you today!    Follow up:  6 months  Labs: please stop by the lab today to get your blood drawn (CBC, CMP, TSH, Lipid profile, HgA1c, Vit D)  Please pick up your refills at the pharmacy  Please continue to a heart-healthy diet and increase your physical activities. Try to exercise for at least five days a week.      It was a pleasure to see you and I look forward to continuing to work together on your health and well-being. Please do not hesitate to call the office if you need care or have questions about your care.   Have a wonderful day and week. With Gratitude, Gilmore Laroche MSN, FNP-BC

## 2022-05-05 NOTE — Assessment & Plan Note (Signed)
Refilled Zyrtec 10 mg daily and Flonase nasal spray

## 2022-05-05 NOTE — Addendum Note (Signed)
Addended by: Herbie Saxon on: 05/05/2022 11:03 AM   Modules accepted: Orders

## 2022-05-06 LAB — CBC WITH DIFFERENTIAL/PLATELET
Basophils Absolute: 0 10*3/uL (ref 0.0–0.2)
Basos: 1 %
EOS (ABSOLUTE): 0 10*3/uL (ref 0.0–0.4)
Eos: 1 %
Hematocrit: 36.5 % (ref 34.0–46.6)
Hemoglobin: 11.6 g/dL (ref 11.1–15.9)
Immature Grans (Abs): 0 10*3/uL (ref 0.0–0.1)
Immature Granulocytes: 0 %
Lymphocytes Absolute: 1.4 10*3/uL (ref 0.7–3.1)
Lymphs: 33 %
MCH: 27.8 pg (ref 26.6–33.0)
MCHC: 31.8 g/dL (ref 31.5–35.7)
MCV: 87 fL (ref 79–97)
Monocytes Absolute: 0.3 10*3/uL (ref 0.1–0.9)
Monocytes: 7 %
Neutrophils Absolute: 2.5 10*3/uL (ref 1.4–7.0)
Neutrophils: 58 %
Platelets: 251 10*3/uL (ref 150–450)
RBC: 4.18 x10E6/uL (ref 3.77–5.28)
RDW: 13.9 % (ref 11.7–15.4)
WBC: 4.3 10*3/uL (ref 3.4–10.8)

## 2022-05-06 LAB — CMP14+EGFR
ALT: 11 IU/L (ref 0–32)
AST: 11 IU/L (ref 0–40)
Albumin/Globulin Ratio: 1.9 (ref 1.2–2.2)
Albumin: 4.5 g/dL (ref 4.0–5.0)
Alkaline Phosphatase: 61 IU/L (ref 42–106)
BUN/Creatinine Ratio: 11 (ref 9–23)
BUN: 9 mg/dL (ref 6–20)
Bilirubin Total: 0.3 mg/dL (ref 0.0–1.2)
CO2: 22 mmol/L (ref 20–29)
Calcium: 9.5 mg/dL (ref 8.7–10.2)
Chloride: 104 mmol/L (ref 96–106)
Creatinine, Ser: 0.81 mg/dL (ref 0.57–1.00)
Globulin, Total: 2.4 g/dL (ref 1.5–4.5)
Glucose: 84 mg/dL (ref 70–99)
Potassium: 3.9 mmol/L (ref 3.5–5.2)
Sodium: 142 mmol/L (ref 134–144)
Total Protein: 6.9 g/dL (ref 6.0–8.5)
eGFR: 107 mL/min/{1.73_m2} (ref >59)

## 2022-05-06 LAB — LIPID PANEL
Chol/HDL Ratio: 3.4 ratio (ref 0.0–4.4)
Cholesterol, Total: 195 mg/dL (ref 100–199)
HDL: 58 mg/dL (ref >39)
LDL Chol Calc (NIH): 130 mg/dL — ABNORMAL HIGH (ref 0–99)
Triglycerides: 38 mg/dL (ref 0–149)
VLDL Cholesterol Cal: 7 mg/dL (ref 5–40)

## 2022-05-06 LAB — HEMOGLOBIN A1C
Est. average glucose Bld gHb Est-mCnc: 117 mg/dL
Hgb A1c MFr Bld: 5.7 % — ABNORMAL HIGH (ref 4.8–5.6)

## 2022-05-06 LAB — TSH+FREE T4
Free T4: 1.26 ng/dL (ref 0.82–1.77)
TSH: 1.3 u[IU]/mL (ref 0.450–4.500)

## 2022-05-06 LAB — HIV ANTIBODY (ROUTINE TESTING W REFLEX): HIV Screen 4th Generation wRfx: NONREACTIVE

## 2022-05-06 LAB — VITAMIN D 25 HYDROXY (VIT D DEFICIENCY, FRACTURES): Vit D, 25-Hydroxy: 13.3 ng/mL — ABNORMAL LOW (ref 30.0–100.0)

## 2022-05-09 ENCOUNTER — Other Ambulatory Visit: Payer: Self-pay | Admitting: Family Medicine

## 2022-05-09 DIAGNOSIS — E559 Vitamin D deficiency, unspecified: Secondary | ICD-10-CM

## 2022-05-09 LAB — CHLAMYDIA/GC NAA, CONFIRMATION
Chlamydia trachomatis, NAA: NEGATIVE
Neisseria gonorrhoeae, NAA: NEGATIVE

## 2022-05-09 MED ORDER — VITAMIN D (ERGOCALCIFEROL) 1.25 MG (50000 UNIT) PO CAPS
50000.0000 [IU] | ORAL_CAPSULE | ORAL | 0 refills | Status: AC
Start: 2022-05-09 — End: ?

## 2022-08-31 ENCOUNTER — Telehealth (INDEPENDENT_AMBULATORY_CARE_PROVIDER_SITE_OTHER): Payer: Medicaid Other | Admitting: Family Medicine

## 2022-08-31 DIAGNOSIS — B349 Viral infection, unspecified: Secondary | ICD-10-CM

## 2022-08-31 MED ORDER — PROMETHAZINE-DM 6.25-15 MG/5ML PO SYRP
5.0000 mL | ORAL_SOLUTION | Freq: Four times a day (QID) | ORAL | 0 refills | Status: DC | PRN
Start: 2022-08-31 — End: 2023-03-02

## 2022-08-31 NOTE — Progress Notes (Signed)
   Virtual Visit via Video Note  I connected with Lori Briggs on 08/31/22 at 11:40 AM EDT by a video enabled telemedicine application and verified that I am speaking with the correct person using two identifiers.  Patient Location: Home Provider Location: Office/Clinic  I discussed the limitations, risks, security, and privacy concerns of performing an evaluation and management service by video and the availability of in person appointments. I also discussed with the patient that there may be a patient responsible charge related to this service. The patient expressed understanding and agreed to proceed.  Subjective: PCP: Gilmore Laroche, FNP  No chief complaint on file.  HPI The patient reports a negative home COVID test 2 days ago.  She reports having fever 2 days ago which has subsided.  Complains today of a nonproductive cough with runny nose.  No somatic symptoms reported.  No body aches headaches or generalized malaise is reported.  Reports taking Tylenol and Robitussin as needed for symptom relief.  ROS: Per HPI  Current Outpatient Medications:    promethazine-dextromethorphan (PROMETHAZINE-DM) 6.25-15 MG/5ML syrup, Take 5 mLs by mouth 4 (four) times daily as needed., Disp: 118 mL, Rfl: 0   cetirizine (ZYRTEC) 10 MG tablet, Take 1 tablet (10 mg total) by mouth daily., Disp: 30 tablet, Rfl: 5   fluticasone (FLONASE) 50 MCG/ACT nasal spray, Place 2 sprays into both nostrils daily., Disp: 16 g, Rfl: 2   SUMAtriptan (IMITREX) 25 MG tablet, Take 1 tablet (25 mg total) by mouth every 2 (two) hours as needed for migraine. Max: 200 mg PO daily, Disp: 10 tablet, Rfl: 0   Vitamin D, Ergocalciferol, (DRISDOL) 1.25 MG (50000 UNIT) CAPS capsule, Take 1 capsule (50,000 Units total) by mouth once a week., Disp: 20 capsule, Rfl: 0  Observations/Objective: There were no vitals filed for this visit. Physical Exam Patient is well-developed, well-nourished in no acute distress.  Resting  comfortably at home.  Head is normocephalic, atraumatic.  No labored breathing.  Speech is clear and coherent with logical content.  Patient is alert and oriented at baseline.   Assessment and Plan: Viral illness -     Promethazine-DM; Take 5 mLs by mouth 4 (four) times daily as needed.  Dispense: 118 mL; Refill: 0  Encouraged rest, increase hydration and a well-balanced diet Encouraged to follow-up for worsening of her symptoms  Follow Up Instructions: No follow-ups on file.   I discussed the assessment and treatment plan with the patient. The patient was provided an opportunity to ask questions, and all were answered. The patient agreed with the plan and demonstrated an understanding of the instructions.   The patient was advised to call back or seek an in-person evaluation if the symptoms worsen or if the condition fails to improve as anticipated.  The above assessment and management plan was discussed with the patient. The patient verbalized understanding of and has agreed to the management plan.   Gilmore Laroche, FNP

## 2022-09-21 ENCOUNTER — Encounter: Payer: Self-pay | Admitting: *Deleted

## 2022-11-03 ENCOUNTER — Ambulatory Visit: Payer: Medicaid Other | Admitting: Family Medicine

## 2022-11-21 ENCOUNTER — Encounter: Payer: Self-pay | Admitting: Family Medicine

## 2023-01-16 ENCOUNTER — Encounter: Payer: Self-pay | Admitting: Family Medicine

## 2023-01-16 ENCOUNTER — Ambulatory Visit (INDEPENDENT_AMBULATORY_CARE_PROVIDER_SITE_OTHER): Payer: Self-pay | Admitting: Family Medicine

## 2023-01-16 ENCOUNTER — Ambulatory Visit: Payer: Self-pay | Admitting: Family Medicine

## 2023-01-16 VITALS — BP 116/64 | HR 81 | Ht 60.5 in | Wt 155.0 lb

## 2023-01-16 DIAGNOSIS — E038 Other specified hypothyroidism: Secondary | ICD-10-CM

## 2023-01-16 DIAGNOSIS — E559 Vitamin D deficiency, unspecified: Secondary | ICD-10-CM

## 2023-01-16 DIAGNOSIS — R7301 Impaired fasting glucose: Secondary | ICD-10-CM

## 2023-01-16 DIAGNOSIS — J301 Allergic rhinitis due to pollen: Secondary | ICD-10-CM

## 2023-01-16 DIAGNOSIS — E7849 Other hyperlipidemia: Secondary | ICD-10-CM

## 2023-01-16 NOTE — Patient Instructions (Addendum)
 I appreciate the opportunity to provide care to you today!    Follow up:  Feb 2025 for pap smear  Labs: please stop by the lab during to get your blood drawn (CBC, CMP, TSH, Lipid profile, HgA1c, Vit D)   Attached with your AVS, you will find valuable resources for self-education. I highly recommend dedicating some time to thoroughly examine them.   Please continue to a heart-healthy diet and increase your physical activities. Try to exercise for at least five days a week.    It was a pleasure to see you and I look forward to continuing to work together on your health and well-being. Please do not hesitate to call the office if you need care or have questions about your care.  In case of emergency, please visit the Emergency Department for urgent care, or contact our clinic at [phone] to schedule an appointment. We're here to help you!   Have a wonderful day and week. With Gratitude, Biran Mayberry MSN, FNP-BC

## 2023-01-16 NOTE — Progress Notes (Signed)
 Established Patient Office Visit  Subjective:  Patient ID: Lori Briggs, female    DOB: 06/09/2000  Age: 23 y.o. MRN: [id number]  CC:  Chief Complaint  Patient presents with   Care Management    Pt f/u , reports seasonal allergies.     HPI Lori Briggs is a 23 y.o. female with past medical history of seasonal allergies presents for f/u of  chronic medical condition. For the details of today's visit, please refer to the assessment and plan.     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 No pertinent surgical history.  Family History  Problem Relation Age of Onset   Diabetes Mother    Hyperlipidemia Mother    Diabetes Maternal Grandmother    Diabetes Maternal Grandfather    Heart disease Maternal Grandfather    Hypertension Maternal Grandfather    Diabetes Paternal Grandmother    Healthy Father    Anemia Sister    Brain cancer Brother        disseminated glioma in leptomeninges   Seizures Brother        had cystic lesions develop after age 87 , poss neurcystercosis-unconfirmed   Hydrocephalus Brother        acquired v/p shunt age 63   Diabetes Maternal Aunt     Social History   Socioeconomic History   Marital status: Single    Spouse name: Not on file   Number of children: Not on file   Years of education: Not on file   Highest education level: Not on file  Occupational History   Occupation: [employer]    Comment: astronomer- trains all positions  Tobacco Use   Smoking status: Never   Smokeless tobacco: Never  Substance and Sexual Activity   Alcohol use: No   Drug use: No   Sexual activity: Yes    Birth control/protection: Condom  Other Topics Concern   Not on file  Social History Narrative    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 30 tablet 5   fluticasone  (FLONASE ) 50 MCG/ACT nasal spray Place 2 sprays into both nostrils daily. 16 g 2   promethazine -dextromethorphan (PROMETHAZINE -DM) 6.25-15 MG/5ML syrup Take 5 mLs by mouth 4 (four) times daily as needed. 118 mL 0   SUMAtriptan  (IMITREX ) 25 MG tablet Take 1 tablet (25 mg total) by mouth every 2 (two) hours as needed for migraine. Max: 200 mg PO daily 10 tablet 0   Vitamin D , Ergocalciferol , (DRISDOL ) 1.25 MG (50000 UNIT) CAPS capsule Take 1 capsule (50,000 Units total) by mouth once a week. 20 capsule 0   No facility-administered medications prior to visit.  Allergies  Allergen Reactions   Banana Swelling and Rash   Penicillins Rash    ROS Review of Systems  Constitutional:  Negative for chills and fever.  Eyes:  Negative for visual disturbance.  Respiratory:  Negative for chest tightness and shortness of breath.   Neurological:  Negative for dizziness and headaches.      Objective:    Physical Exam HENT:     Head: Normocephalic.     Mouth/Throat:     Mouth: Mucous membranes are moist.  Cardiovascular:     Rate and Lori: Normal rate.     Heart sounds: Normal heart sounds.  Pulmonary:     Effort: Pulmonary effort is normal.     Breath sounds: Normal breath sounds.  Neurological:     Mental Status: She is alert.     BP 116/64   Pulse 81   Ht 5' 0.5 (1.537 m)   Wt 155 lb 0.6 oz (70.3 kg)   SpO2 98%   BMI 29.78 kg/m  Wt Readings from Last 3  Encounters:  01/16/23 155 lb 0.6 oz (70.3 kg)  05/05/22 170 lb 1.9 oz (77.2 kg)  03/20/22 178 lb (80.7 kg)    Lab Results  Component Value Date   TSH 1.300 05/05/2022   Lab Results  Component Value Date   WBC 4.3 05/05/2022   HGB 11.6 05/05/2022   HCT 36.5 05/05/2022   MCV 87 05/05/2022   PLT 251 05/05/2022   Lab Results  Component Value Date   NA 142 05/05/2022   K 3.9 05/05/2022   CO2 22 05/05/2022   GLUCOSE 84 05/05/2022   BUN 9 05/05/2022   CREATININE 0.81 05/05/2022   BILITOT 0.3 05/05/2022   ALKPHOS 61 05/05/2022   AST 11 05/05/2022   ALT 11 05/05/2022   PROT 6.9 05/05/2022   ALBUMIN 4.5 05/05/2022   CALCIUM 9.5 05/05/2022   ANIONGAP 6 07/17/2017   EGFR 107 05/05/2022   Lab Results  Component Value Date   CHOL 195 05/05/2022   Lab Results  Component Value Date   HDL 58 05/05/2022   Lab Results  Component Value Date   LDLCALC 130 (H) 05/05/2022   Lab Results  Component Value Date   TRIG 38 05/05/2022   Lab Results  Component Value Date   CHOLHDL 3.4 05/05/2022   Lab Results  Component Value Date   HGBA1C 5.7 (H) 05/05/2022      Assessment & Plan:  Non-seasonal allergic rhinitis due to pollen Assessment & Plan: stable   IFG (impaired fasting glucose) -     Hemoglobin A1c  Vitamin D  deficiency -     VITAMIN D  25 Hydroxy (Vit-D Deficiency, Fractures)  TSH (thyroid -stimulating hormone deficiency) -     TSH + free T4  Other hyperlipidemia -     Lipid panel -     CMP14+EGFR -     CBC with Differential/Platelet  Note: This chart has been completed using [employer] (consulting) software, and while attempts have been made to ensure accuracy, certain words and phrases may not be transcribed as intended.    Follow-up: No follow-ups on file.   Terena Bohan, FNP

## 2023-01-18 NOTE — Assessment & Plan Note (Signed)
 stable

## 2023-01-24 ENCOUNTER — Encounter (INDEPENDENT_AMBULATORY_CARE_PROVIDER_SITE_OTHER): Payer: Self-pay

## 2023-03-02 ENCOUNTER — Ambulatory Visit: Payer: Self-pay | Admitting: Family Medicine

## 2023-03-02 ENCOUNTER — Encounter: Payer: Self-pay | Admitting: Family Medicine

## 2023-03-02 ENCOUNTER — Ambulatory Visit (INDEPENDENT_AMBULATORY_CARE_PROVIDER_SITE_OTHER): Payer: 59 | Admitting: Family Medicine

## 2023-03-02 VITALS — BP 116/78 | Ht 60.6 in | Wt 146.0 lb

## 2023-03-02 DIAGNOSIS — B349 Viral infection, unspecified: Secondary | ICD-10-CM

## 2023-03-02 HISTORY — DX: Viral infection, unspecified: B34.9

## 2023-03-02 MED ORDER — LEVOCETIRIZINE DIHYDROCHLORIDE 5 MG PO TABS: 5.0000 mg | ORAL_TABLET | Freq: Every evening | ORAL | 1 refills | Status: AC

## 2023-03-02 MED ORDER — PROMETHAZINE-DM 6.25-15 MG/5ML PO SYRP
5.0000 mL | ORAL_SOLUTION | Freq: Four times a day (QID) | ORAL | 0 refills | Status: DC | PRN
Start: 2023-03-02 — End: 2023-08-01

## 2023-03-02 NOTE — Assessment & Plan Note (Signed)
 COVID-19, Flu A+B RSV ordered   Promethazine syrup PRN, Xyzal 5 mg at bedtime. Advise patient to rest to support your body's recovery. Stay hydrated by drinking water, tea, or broth. Using a humidifier can help soothe throat irritation and ease nasal congestion. For fever or pain, acetaminophen (Tylenol) is recommended. To relieve other symptoms, try saline nasal sprays, throat lozenges, or gargling with saltwater. Focus on eating light, healthy meals like fruits and vegetables to keep your strength up. Practice good hygiene by washing your hands frequently and covering your mouth when coughing or sneezing.Follow-up for worsening or persistent symptoms. Patient verbalizes understanding regarding plan of care and all questions answered

## 2023-03-02 NOTE — Progress Notes (Signed)
 Established Patient Office Visit   Subjective  Patient ID: Lori Briggs, female    DOB: September 27, 2000  Age: 23 y.o. 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  Patient complains of cough. Patient describes symptoms of fever, cough, fatigue, headache, malaise, and sputum production. Symptoms began a few days ago and are unchanged since that time. Patient denies chest pain or nausea and vomiting. Treatment thus far includes OTC analgesics/antipyretics: somewhat effective Past pulmonary history is significant for no history of pneumonia or bronchitis     Review of Systems  Constitutional:  Positive for fever and malaise/fatigue.  HENT:  Positive for congestion.   Respiratory:  Positive for cough. Negative for shortness of breath.   Cardiovascular:  Negative for chest pain.  Neurological:  Positive for headaches. Negative for dizziness.      Objective:     BP 116/78   Ht 5' 0.6" (1.539 m)   Wt 146 lb 0.6 oz (66.2 kg)   LMP 02/17/2023 (Exact Date)   BMI 27.96 kg/m  BP Readings from Last 3 Encounters:  03/02/23 116/78  01/16/23 116/64  05/05/22 124/85      Physical Exam Vitals reviewed.  Constitutional:      General: She is not in acute distress.    Appearance: Normal appearance. She is not ill-appearing, toxic-appearing or diaphoretic.  HENT:     Head: Normocephalic.     Nose: Congestion present.     Mouth/Throat:     Pharynx: Posterior oropharyngeal erythema present.  Eyes:     General:        Right eye: No discharge.        Left eye: No discharge.     Conjunctiva/sclera: Conjunctivae normal.  Cardiovascular:     Rate  and Rhythm: Normal rate.     Pulses: Normal pulses.     Heart sounds: Normal heart sounds.  Pulmonary:     Effort: Pulmonary effort is normal. No respiratory distress.     Breath sounds: Normal breath sounds.  Abdominal:     General: Bowel sounds are normal.     Palpations: Abdomen is soft.     Tenderness: There is no abdominal tenderness. There is no guarding.  Musculoskeletal:        General: Normal range of motion.     Cervical back: Normal range of motion.  Lymphadenopathy:     Cervical: Cervical adenopathy present.  Skin:    General: Skin is warm and dry.     Capillary Refill: Capillary refill takes less than 2 seconds.  Neurological:     Mental Status: She is alert.     Coordination: Coordination normal.     Gait: Gait normal.  Psychiatric:        Mood and Affect: Mood normal.        Behavior: Behavior normal.      No results found for any visits on 03/02/23.  The ASCVD Risk score (Arnett DK, et al., 2019) failed to calculate for the following reasons:   The 2019 ASCVD risk score is only valid for ages 42 to 73    Assessment & Plan:  Viral illness Assessment & Plan: COVID-19, Flu A+B RSV ordered  Promethazine syrup PRN, Xyzal 5 mg at bedtime. Advise patient to rest to support your body's recovery. Stay hydrated by drinking water, tea, or broth. Using a humidifier can help soothe throat irritation and ease nasal congestion. For fever or pain, acetaminophen (Tylenol) is recommended. To relieve other symptoms, try saline nasal sprays, throat lozenges, or gargling with saltwater. Focus on eating light, healthy meals like fruits and vegetables to keep your strength up. Practice good hygiene by washing your hands frequently and covering your mouth when coughing or sneezing.Follow-up for worsening or persistent symptoms. Patient verbalizes understanding regarding plan of care and all questions answered      Orders: -     COVID-19, Flu A+B and RSV -     Promethazine-DM;  Take 5 mLs by mouth 4 (four) times daily as needed.  Dispense: 118 mL; Refill: 0  Other orders -     Levocetirizine Dihydrochloride; Take 1 tablet (5 mg total) by mouth every evening.  Dispense: 30 tablet; Refill: 1    Return in 4 weeks (on 03/30/2023), or if symptoms worsen or fail to improve, for Pap smear.   Cruzita Lederer Newman Nip, FNP

## 2023-03-02 NOTE — Patient Instructions (Signed)

## 2023-03-06 ENCOUNTER — Ambulatory Visit: Payer: Self-pay | Admitting: Family Medicine

## 2023-03-06 ENCOUNTER — Encounter: Payer: Self-pay | Admitting: Family Medicine

## 2023-03-06 ENCOUNTER — Other Ambulatory Visit: Payer: Self-pay | Admitting: Family Medicine

## 2023-03-06 LAB — COVID-19, FLU A+B AND RSV
Influenza A, NAA: DETECTED — AB
Influenza B, NAA: NOT DETECTED
RSV, NAA: NOT DETECTED
SARS-CoV-2, NAA: NOT DETECTED

## 2023-03-06 MED ORDER — OSELTAMIVIR PHOSPHATE 75 MG PO CAPS
75.0000 mg | ORAL_CAPSULE | Freq: Two times a day (BID) | ORAL | 0 refills | Status: AC
Start: 2023-03-06 — End: 2023-03-11

## 2023-04-12 ENCOUNTER — Ambulatory Visit: Payer: Self-pay | Admitting: Family Medicine

## 2023-04-26 ENCOUNTER — Other Ambulatory Visit (HOSPITAL_COMMUNITY)
Admission: RE | Admit: 2023-04-26 | Discharge: 2023-04-26 | Disposition: A | Discharge status: Home / Self Care | Source: Ambulatory Visit | Attending: Family Medicine | Admitting: Family Medicine

## 2023-04-26 ENCOUNTER — Encounter: Payer: Self-pay | Admitting: Family Medicine

## 2023-04-26 ENCOUNTER — Ambulatory Visit (INDEPENDENT_AMBULATORY_CARE_PROVIDER_SITE_OTHER): Payer: 59 | Admitting: Family Medicine

## 2023-04-26 VITALS — BP 102/72 | HR 63 | Resp 18 | Ht 60.0 in | Wt 140.0 lb

## 2023-04-26 DIAGNOSIS — Z124 Encounter for screening for malignant neoplasm of cervix: Secondary | ICD-10-CM | POA: Insufficient documentation

## 2023-04-26 NOTE — Patient Instructions (Addendum)
 I appreciate the opportunity to provide care to you today!    Follow up: 6 months  You will be informed of your Pap smear results via MyChart once they are available.   Attached with your AVS, you will find valuable resources for self-education. I highly recommend dedicating some time to thoroughly examine them.   Please continue to a heart-healthy diet and increase your physical activities. Try to exercise for at least five days a week.    It was a pleasure to see you and I look forward to continuing to work together on your health and well-being. Please do not hesitate to call the office if you need care or have questions about your care.  In case of emergency, please visit the Emergency Department for urgent care, or contact our clinic at [phone] to schedule an appointment. We're here to help you!   Have a wonderful day and week. With Gratitude, Eleisha Branscomb MSN, FNP-BC

## 2023-04-26 NOTE — Progress Notes (Addendum)
 Acute Office Visit  Subjective:    Patient ID: Lori Briggs, female    DOB: August 05, 2000, 23 y.o.   MRN: [id number]  Chief Complaint  Patient presents with   Gynecologic Exam    Pap    Gynecologic Exam Pertinent negatives include no chills, fever or headaches.   Patient is in today for pap smear examination.  For the details of today's visit, please refer to the assessment and plan.     Past Medical History:  Diagnosis Date   Adjustment disorder of adolescence 08/25/2014   Allergy    Asthma    Bereavement    BMI (body mass index), pediatric, 95-99% for age 68/17/2016   Constipation    Dizzy spells    Dyspepsia 05/20/2014   Obesity    Prediabetes    Scoliosis     History reviewed. No pertinent surgical history.  Family History  Problem Relation Age of Onset   Diabetes Mother    Hyperlipidemia Mother    Diabetes Maternal Grandmother    Diabetes Maternal Grandfather    Heart disease Maternal Grandfather    Hypertension Maternal Grandfather    Diabetes Paternal Grandmother    Healthy Father    Anemia Sister    Brain cancer Brother        disseminated glioma in leptomeninges   Seizures Brother        had cystic lesions develop after age 65 , poss neurcystercosis-unconfirmed   Hydrocephalus Brother        acquired v/p shunt age 90   Diabetes Maternal Aunt     Social History   Socioeconomic History   Marital status: Single    Spouse name: Not on file   Number of children: Not on file   Years of education: Not on file   Highest education level: Not on file  Occupational History   Occupation: [employer]    Comment: Astronomer- trains all positions  Tobacco Use   Smoking status: Never   Smokeless tobacco: Never  Substance and Sexual Activity   Alcohol use: No   Drug use: No   Sexual activity: Yes    Birth control/protection: Condom  Other Topics Concern   Not on file  Social History Narrative   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 30 tablet 5   fluticasone (FLONASE) 50 MCG/ACT nasal spray Place 2 sprays into both nostrils daily. 16 g 2   levocetirizine (XYZAL) 5 MG tablet Take 1 tablet (5 mg total) by mouth every evening. 30 tablet 1   Vitamin D, Ergocalciferol, (DRISDOL) 1.25 MG (50000 UNIT) CAPS capsule Take 1 capsule (50,000 Units total) by mouth once a week. 20 capsule 0   promethazine-dextromethorphan (PROMETHAZINE-DM) 6.25-15 MG/5ML syrup Take 5 mLs by mouth 4 (four) times daily as needed. 118 mL 0   SUMAtriptan (IMITREX) 25 MG tablet Take 1 tablet (25 mg total) by mouth every 2 (two) hours as needed for migraine. Max: 200 mg PO daily (Patient not  taking: Reported on 03/02/2023) 10 tablet 0   No facility-administered medications prior to visit.    Allergies  Allergen Reactions   Banana Swelling and Rash   Penicillins Rash    Review of Systems  Constitutional:  Negative for chills and fever.  Eyes:  Negative for visual disturbance.  Respiratory:  Negative for chest tightness and shortness of breath.   Neurological:  Negative for dizziness and headaches.       Objective:    Physical Exam HENT:     Head: Normocephalic.     Mouth/Throat:     Mouth: Mucous membranes are moist.  Cardiovascular:     Rate and Rhythm: Normal rate.     Heart sounds: Normal heart sounds.  Pulmonary:     Effort: Pulmonary effort is normal.     Breath sounds: Normal breath sounds.  Genitourinary:    Comments: Vaginal wall: pink and rugated, smooth and non-tender; absence of lesions, edema, and  erythema. Labia Majora and Minora: present bilaterally, moist, soft tissue, and homogeneous; free of edema and ulcerations. Clitoris is anatomically present, above the urethral, and free of lesions, masses, and ulceration.    Neurological:     Mental Status: She is alert.     BP 102/72   Pulse 63   Resp 18   Ht 5' (1.524 m)   Wt 140 lb (63.5 kg)   SpO2 98%   BMI 27.34 kg/m  Wt Readings from Last 3 Encounters:  04/26/23 140 lb (63.5 kg)  03/02/23 146 lb 0.6 oz (66.2 kg)  01/16/23 155 lb 0.6 oz (70.3 kg)       Assessment & Plan:  Cervical cancer screening Assessment & Plan: -Cytology and HPV co-testing (preferred) every 5 years or cytology alone (acceptable) every 3 years. - Pap due 2030    Orders: -     Cytology - PAP  Note: This chart has been completed using Engineer, civil (consulting) software, and while attempts have been made to ensure accuracy, certain words and phrases may not be transcribed as intended.    Vernita Tague, FNP

## 2023-04-26 NOTE — Assessment & Plan Note (Signed)
-  Cytology and HPV co-testing (preferred) every 5 years or cytology alone (acceptable) every 3 years. - Pap due 2030

## 2023-04-26 NOTE — Addendum Note (Signed)
 Addended by: Miranda Frese on: 04/26/2023 10:38 AM   Modules accepted: Orders

## 2023-04-30 LAB — CYTOLOGY - PAP
Chlamydia: NEGATIVE
Comment: NEGATIVE
Comment: NEGATIVE
Comment: NEGATIVE
Comment: NORMAL
Diagnosis: NEGATIVE
High risk HPV: NEGATIVE
Neisseria Gonorrhea: NEGATIVE
Trichomonas: NEGATIVE

## 2023-05-02 ENCOUNTER — Encounter: Payer: Self-pay | Admitting: Family Medicine

## 2023-05-31 ENCOUNTER — Ambulatory Visit (INDEPENDENT_AMBULATORY_CARE_PROVIDER_SITE_OTHER): Admitting: Internal Medicine

## 2023-05-31 ENCOUNTER — Encounter: Payer: Self-pay | Admitting: Internal Medicine

## 2023-05-31 VITALS — BP 117/64 | HR 66 | Ht 60.0 in | Wt 139.4 lb

## 2023-05-31 DIAGNOSIS — J189 Pneumonia, unspecified organism: Secondary | ICD-10-CM | POA: Diagnosis not present

## 2023-05-31 MED ORDER — AZITHROMYCIN 250 MG PO TABS
ORAL_TABLET | ORAL | 0 refills | Status: DC
Start: 2023-05-31 — End: 2023-08-01

## 2023-05-31 NOTE — Progress Notes (Signed)
 Acute Office Visit  Subjective:     Patient ID: Lori Briggs, female    DOB: [birth year], 23 y.o.   MRN: [id number]  Chief Complaint  Patient presents with   Cough    Cough with chest congestion , yellow mucus for one week    Eye Pain    Left eye pain    Ms. Lori Briggs presents today for an acute visit endorsing a 1 week history of congestion and cough productive of yellow sputum.  Denies fever/chills and shortness of breath.  She is unaware of any recent sick contacts.  Symptoms have worsened despite attempted supportive care measures including over-the-counter cough and cold medications.  Review of Systems  Constitutional:  Positive for malaise/fatigue. Negative for chills and fever.  Respiratory:  Positive for cough and sputum production. Negative for shortness of breath and wheezing.       Objective:    BP 117/64   Pulse 66   Ht 5' (1.524 m)   Wt 139 lb 6.4 oz (63.2 kg)   SpO2 98%   BMI 27.22 kg/m   Physical Exam Vitals reviewed.  Constitutional:      General: She is not in acute distress.    Appearance: Normal appearance. She is not toxic-appearing.  HENT:     Head: Normocephalic and atraumatic.     Right Ear: External ear normal.     Left Ear: External ear normal.     Nose: Nose normal. No congestion or rhinorrhea.     Mouth/Throat:     Mouth: Mucous membranes are moist.     Pharynx: Oropharynx is clear. No oropharyngeal exudate or posterior oropharyngeal erythema.  Eyes:     General: No scleral icterus.    Extraocular Movements: Extraocular movements intact.     Conjunctiva/sclera: Conjunctivae normal.     Pupils: Pupils are equal, round, and reactive to light.  Cardiovascular:     Rate and Rhythm: Normal rate and regular rhythm.     Pulses: Normal pulses.     Heart sounds: Normal heart sounds. No murmur heard.    No friction rub. No gallop.  Pulmonary:     Effort: Pulmonary effort is normal.     Breath sounds: Rhonchi present. No wheezing or rales.   Abdominal:     General: Abdomen is flat. Bowel sounds are normal. There is no distension.     Palpations: Abdomen is soft.     Tenderness: There is no abdominal tenderness.  Musculoskeletal:        General: No swelling. Normal range of motion.     Cervical back: Normal range of motion.     Right lower leg: No edema.     Left lower leg: No edema.  Lymphadenopathy:     Cervical: No cervical adenopathy.  Skin:    General: Skin is warm and dry.     Capillary Refill: Capillary refill takes less than 2 seconds.     Coloration: Skin is not jaundiced.  Neurological:     General: No focal deficit present.     Mental Status: She is alert and oriented to person, place, and time.  Psychiatric:        Mood and Affect: Mood normal.        Behavior: Behavior normal.       Assessment & Plan:   Problem List Items Addressed This Visit       CAP (community acquired pneumonia) - Primary   Presenting today for an acute visit endorsing  1 week history of cough productive of yellow sputum.  Symptoms are worsening overall despite attempted supportive care measures.  Bibasilar rhonchi are noted on exam today. -Treatment options reviewed.  Will empirically cover for CAP with a Z-Pak.  Recommend continuing current supportive care measures.  She was instructed to return to care if symptoms worsen or fail to improve.  Otherwise, she is scheduled for routine follow-up with her PCP in October.       Meds ordered this encounter  Medications   azithromycin  (ZITHROMAX  Z-PAK) 250 MG tablet    Sig: Take 2 tablets (500 mg) PO today, then 1 tablet (250 mg) PO daily x4 days.    Dispense:  6 tablet    Refill:  0    Return if symptoms worsen or fail to improve.  Tobi Fortes, MD

## 2023-05-31 NOTE — Assessment & Plan Note (Signed)
 Presenting today for an acute visit endorsing 1 week history of cough productive of yellow sputum.  Symptoms are worsening overall despite attempted supportive care measures.  Bibasilar rhonchi are noted on exam today. -Treatment options reviewed.  Will empirically cover for CAP with a Z-Pak.  Recommend continuing current supportive care measures.  She was instructed to return to care if symptoms worsen or fail to improve.  Otherwise, she is scheduled for routine follow-up with her PCP in October.

## 2023-05-31 NOTE — Patient Instructions (Signed)
 It was a pleasure to see you today.  Thank you for giving us  the opportunity to be involved in your care.  Below is a brief recap of your visit and next steps.  We will plan to see you again in October.  Summary Z pack prescribed for suspected pneumonia Continue current supportive care measures Return to care if symptoms worsen or fail to improve

## 2023-07-24 ENCOUNTER — Emergency Department (HOSPITAL_COMMUNITY)

## 2023-07-24 ENCOUNTER — Other Ambulatory Visit: Payer: Self-pay

## 2023-07-24 ENCOUNTER — Ambulatory Visit: Payer: Self-pay

## 2023-07-24 ENCOUNTER — Emergency Department (HOSPITAL_COMMUNITY): Admission: EM | Admit: 2023-07-24 | Discharge: 2023-07-24 | Disposition: A | Discharge status: Home / Self Care

## 2023-07-24 ENCOUNTER — Encounter (HOSPITAL_COMMUNITY): Payer: Self-pay

## 2023-07-24 DIAGNOSIS — Z3A01 Less than 8 weeks gestation of pregnancy: Secondary | ICD-10-CM | POA: Diagnosis not present

## 2023-07-24 DIAGNOSIS — O26891 Other specified pregnancy related conditions, first trimester: Secondary | ICD-10-CM | POA: Insufficient documentation

## 2023-07-24 DIAGNOSIS — Z79899 Other long term (current) drug therapy: Secondary | ICD-10-CM | POA: Insufficient documentation

## 2023-07-24 DIAGNOSIS — R102 Pelvic and perineal pain: Secondary | ICD-10-CM | POA: Diagnosis not present

## 2023-07-24 LAB — CBC WITH DIFFERENTIAL/PLATELET
Abs Immature Granulocytes: 0 K/uL (ref 0.00–0.07)
Band Neutrophils: 1 %
Basophils Absolute: 0 K/uL (ref 0.0–0.1)
Basophils Relative: 0 %
Eosinophils Absolute: 0 K/uL (ref 0.0–0.5)
Eosinophils Relative: 0 %
HCT: 34.3 % — ABNORMAL LOW (ref 36.0–46.0)
Hemoglobin: 11.6 g/dL — ABNORMAL LOW (ref 12.0–15.0)
Lymphocytes Relative: 32 %
Lymphs Abs: 1.3 K/uL (ref 0.7–4.0)
MCH: 29 pg (ref 26.0–34.0)
MCHC: 33.8 g/dL (ref 30.0–36.0)
MCV: 85.8 fL (ref 80.0–100.0)
Monocytes Absolute: 0.1 K/uL (ref 0.1–1.0)
Monocytes Relative: 2 %
Neutro Abs: 2.6 K/uL (ref 1.7–7.7)
Neutrophils Relative %: 65 %
Platelets: 202 K/uL (ref 150–400)
RBC: 4 MIL/uL (ref 3.87–5.11)
RDW: 13.2 % (ref 11.5–15.5)
WBC: 4 K/uL (ref 4.0–10.5)
nRBC: 0 % (ref 0.0–0.2)

## 2023-07-24 LAB — COMPREHENSIVE METABOLIC PANEL WITH GFR
ALT: 11 U/L (ref 0–44)
AST: 16 U/L (ref 15–41)
Albumin: 3.9 g/dL (ref 3.5–5.0)
Alkaline Phosphatase: 35 U/L — ABNORMAL LOW (ref 38–126)
Anion gap: 13 (ref 5–15)
BUN: 8 mg/dL (ref 6–20)
CO2: 21 mmol/L — ABNORMAL LOW (ref 22–32)
Calcium: 9.2 mg/dL (ref 8.9–10.3)
Chloride: 103 mmol/L (ref 98–111)
Creatinine, Ser: 0.54 mg/dL (ref 0.44–1.00)
GFR, Estimated: >60 mL/min (ref >60)
Glucose, Bld: 78 mg/dL (ref 70–99)
Potassium: 3.3 mmol/L — ABNORMAL LOW (ref 3.5–5.1)
Sodium: 137 mmol/L (ref 135–145)
Total Bilirubin: 0.8 mg/dL (ref 0.0–1.2)
Total Protein: 6.9 g/dL (ref 6.5–8.1)

## 2023-07-24 LAB — URINALYSIS, ROUTINE W REFLEX MICROSCOPIC
Bacteria, UA: NONE SEEN
Bilirubin Urine: NEGATIVE
Glucose, UA: NEGATIVE mg/dL
Hgb urine dipstick: NEGATIVE
Ketones, ur: 80 mg/dL — AB
Leukocytes,Ua: NEGATIVE
Nitrite: NEGATIVE
Protein, ur: 100 mg/dL — AB
Specific Gravity, Urine: 1.026 (ref 1.005–1.030)
pH: 6 (ref 5.0–8.0)

## 2023-07-24 LAB — PREGNANCY, URINE: Preg Test, Ur: POSITIVE — AB

## 2023-07-24 LAB — HCG, QUANTITATIVE, PREGNANCY: hCG, Beta Chain, Quant, S: 8222 m[IU]/mL — ABNORMAL HIGH (ref <5)

## 2023-07-24 LAB — ABO/RH: ABO/RH(D): A POS

## 2023-07-24 NOTE — ED Notes (Signed)
 Pt given a carbonated beverage. Tolerated po fluids well.

## 2023-07-24 NOTE — ED Provider Notes (Signed)
 [place] EMERGENCY DEPARTMENT AT [facility] Provider Note   CSN: [id number] Arrival date & time: 07/24/23  1727     Patient presents with: Abdominal Cramping   Lori Briggs is a 23 y.o. female.    Abdominal Cramping Pertinent negatives include no chest pain, no abdominal pain and no shortness of breath.    Patient presents because of pelvic cramping.  Patient states that she had a home positive pregnancy test yesterday.  Patient states that her last menstrual cycle was June 9 to 13.  Patient states she usually has a very regular menstrual cycle.  Has not been spotting.  No blood.  No fever no chills.  No chest pain or shortness of breath.  No dysuria.  No abnormal vaginal discharge.  Patient states that she had STD testing a week ago that was negative.      Prior to Admission medications   Medication Sig Start Date End Date Taking? Authorizing Provider  azithromycin  (ZITHROMAX  Z-PAK) 250 MG tablet Take 2 tablets (500 mg) PO today, then 1 tablet (250 mg) PO daily x4 days. 05/31/23   Melvenia Manus BRAVO, MD  cetirizine  (ZYRTEC ) 10 MG tablet Take 1 tablet (10 mg total) by mouth daily. 05/05/22   Zarwolo, Gloria, FNP  fluticasone  (FLONASE ) 50 MCG/ACT nasal spray Place 2 sprays into both nostrils daily. 05/05/22   Zarwolo, Gloria, FNP  levocetirizine (XYZAL ) 5 MG tablet Take 1 tablet (5 mg total) by mouth every evening. 03/02/23   Del Orbe Polanco, Iliana, FNP  promethazine -dextromethorphan (PROMETHAZINE -DM) 6.25-15 MG/5ML syrup Take 5 mLs by mouth 4 (four) times daily as needed. 03/02/23   Del Orbe Polanco, Iliana, FNP  SUMAtriptan  (IMITREX ) 25 MG tablet Take 1 tablet (25 mg total) by mouth every 2 (two) hours as needed for migraine. Max: 200 mg PO daily Patient not taking: Reported on 03/02/2023 03/20/22   Zarwolo, Gloria, FNP  Vitamin D , Ergocalciferol , (DRISDOL ) 1.25 MG (50000 UNIT) CAPS capsule Take 1 capsule (50,000 Units total) by mouth once a week. 05/09/22   Zarwolo, Gloria,  FNP    Allergies: Banana and Penicillins    Review of Systems  Constitutional:  Negative for chills and fever.  HENT:  Negative for ear pain and sore throat.   Eyes:  Negative for pain and visual disturbance.  Respiratory:  Negative for cough and shortness of breath.   Cardiovascular:  Negative for chest pain and palpitations.  Gastrointestinal:  Negative for abdominal pain and vomiting.  Genitourinary:  Negative for dysuria and hematuria.  Musculoskeletal:  Negative for arthralgias and back pain.  Skin:  Negative for color change and rash.  Neurological:  Negative for seizures and syncope.  All other systems reviewed and are negative.   Updated Vital Signs BP 115/63   Pulse 66   Temp 98.2 F (36.8 C) (Oral)   Resp 18   Ht 5' (1.524 m)   Wt 63.2 kg   LMP 02/17/2023 (Exact Date)   SpO2 100%   BMI 27.21 kg/m   Physical Exam Vitals and nursing note reviewed.  Constitutional:      General: She is not in acute distress.    Appearance: She is well-developed.  HENT:     Head: Normocephalic and atraumatic.  Eyes:     Conjunctiva/sclera: Conjunctivae normal.  Cardiovascular:     Rate and Rhythm: Normal rate and regular rhythm.     Heart sounds: No murmur heard. Pulmonary:     Effort: Pulmonary effort is normal. No respiratory  distress.     Breath sounds: Normal breath sounds.  Abdominal:     Palpations: Abdomen is soft.     Tenderness: There is no abdominal tenderness.  Musculoskeletal:        General: No swelling.     Cervical back: Neck supple.  Skin:    General: Skin is warm and dry.     Capillary Refill: Capillary refill takes less than 2 seconds.  Neurological:     Mental Status: She is alert.  Psychiatric:        Mood and Affect: Mood normal.     (all labs ordered are listed, but only abnormal results are displayed) Labs Reviewed  COMPREHENSIVE METABOLIC PANEL WITH GFR - Abnormal; Notable for the following components:      Result Value   Potassium 3.3  (*)    CO2 21 (*)    Alkaline Phosphatase 35 (*)    All other components within normal limits  CBC WITH DIFFERENTIAL/PLATELET - Abnormal; Notable for the following components:   Hemoglobin 11.6 (*)    HCT 34.3 (*)    All other components within normal limits  HCG, QUANTITATIVE, PREGNANCY - Abnormal; Notable for the following components:   hCG, Beta Chain, Quant, S 8,222 (*)    All other components within normal limits  URINALYSIS, ROUTINE W REFLEX MICROSCOPIC - Abnormal; Notable for the following components:   APPearance HAZY (*)    Ketones, ur 80 (*)    Protein, ur 100 (*)    All other components within normal limits  PREGNANCY, URINE - Abnormal; Notable for the following components:   Preg Test, Ur POSITIVE (*)    All other components within normal limits  ABO/RH    EKG: None  Radiology: US  OB LESS THAN 14 WEEKS WITH OB TRANSVAGINAL Result Date: 07/24/2023 CLINICAL DATA:  Pelvic pain and positive pregnancy test EXAM: OBSTETRIC <14 WK US  AND TRANSVAGINAL OB US  TECHNIQUE: Both transabdominal and transvaginal ultrasound examinations were performed for complete evaluation of the gestation as well as the maternal uterus, adnexal regions, and pelvic cul-de-sac. Transvaginal technique was performed to assess early pregnancy. COMPARISON:  None Available. FINDINGS: Intrauterine gestational sac: Present Yolk sac:  Present Embryo:  Absent MSD: 10.4 mm   5 w   4 d Subchorionic hemorrhage:  None visualized. Maternal uterus/adnexae: Focal hyperechoic lesion is noted within the right ovary measuring 1 cm. This may represent a small dermoid. Left ovary is within normal limits. No free fluid is seen. IMPRESSION: Probable early intrauterine gestational sac and yolk sac, but no fetal pole, or cardiac activity yet visualized. Recommend follow-up quantitative B-HCG levels and follow-up US  in 14 days to assess viability. This recommendation follows SRU consensus guidelines: Diagnostic Criteria for Nonviable  Pregnancy Early in the First Trimester. LOISE Diedra PARAS Med 2013; 630:8556-48. Electronically Signed   By: Oneil Devonshire M.D.   On: 07/24/2023 20:20     Procedures   Medications Ordered in the ED - No data to display                                  Medical Decision Making Amount and/or Complexity of Data Reviewed Labs: ordered. Radiology: ordered.    Patient presents because of pelvic cramping.  Patient states that she had a home positive pregnancy test yesterday.  Patient states that her last menstrual cycle was June 9 to 13.  Patient states she usually has a  very regular menstrual cycle.  Has not been spotting.  No blood.  No fever no chills.  No chest pain or shortness of breath.  No dysuria  No abnormal vaginal discharge.  Patient states that she had STD testing a week ago that was negative.  On exam, patient hemodynamically stable.  ANO x 3 GCS of 15.  Benign abdomen.  No rebound guarding or tenderness.   Obtain transvaginal ultrasound.  Showed intrauterine gestational sac.  Very early on.  Recommended patient have repeat imaging in 2 weeks as well as ongoing beta hCG levels.  Explained this to the patient.  She understands this.   Offered to perform pelvic exam to rule in, STD or other pelvic infection.  Patient declines this.  States that she had recent STD testing about a week ago which was negative.   UA showed no evidence of UTI.  Rh positive.   Patient to follow-up with OB.        Final diagnoses:  Less than [redacted] weeks gestation of pregnancy    ED Discharge Orders     None          Simon Lavonia SAILOR, MD 07/24/23 2128

## 2023-07-24 NOTE — Telephone Encounter (Signed)
 FYI Only or Action Required?: FYI only for provider.  Patient was last seen in primary care on 05/31/2023 by Melvenia Manus BRAVO, MD.  Called Nurse Triage reporting Abdominal Cramping.  Symptoms began yesterday.  Interventions attempted: Nothing.  Symptoms are: gradually worsening.  Triage Disposition: See HCP Within 4 Hours (Or PCP Triage)  Patient/caregiver understands and will follow disposition?: Yes    Copied from CRM [id number]. Topic: Clinical - Red Word Triage >> Jul 24, 2023  3:07 PM Elle L wrote: Red Word that prompted transfer to Nurse Triage: The patient took a pregnancy test yesterday and it was positive. However, she has been having painful cramps and she is unsure what to do. Reason for Disposition  [1] Constant abdominal pain AND [2] present > 2 hours  Answer Assessment - Initial Assessment Questions 1. LOCATION: Where does it hurt?      Lower belly  2. RADIATION: Does the pain shoot anywhere else? (e.g., chest, back, shoulder)     No  3. ONSET: When did the pain begin? (e.g., minutes, hours or days ago)     Has been having period cramps for 2 weeks and then pain started 3-4 days ago  4. ONSET: Gradual or sudden onset?     Gradual onset  5. PATTERN Does the pain come and go, or has it been constant since it started?      Constant, fluctuates in pain levels  6. SEVERITY: How bad is the pain? What does it keep you from doing?  (e.g., Scale 1-10; mild, moderate, or severe)     2-3/10  7. RECURRENT SYMPTOM: Have you ever had this type of stomach pain before? If Yes, ask: When was the last time? and What happened that time?      Yes, had cyst on ovaries previously   8. CAUSE: What do you think is causing the stomach pain?     Unsure, concerned about pos pregnancy test  9. RELIEVING/AGGRAVATING FACTORS: What makes it better or worse? (e.g., antacids, bowel movement, movement)     Laying down  10. OTHER SYMPTOMS: Do you have any other  symptoms? (e.g., back pain, diarrhea, fever, urination pain, vaginal bleeding, vaginal discharge, vomiting)       Nausea and vomiting, poor appetite, breast tender  11. EDD: What date are you expecting to deliver?       Unsure  Protocols used: Pregnancy - Abdominal Pain Less Than [redacted] Weeks EGA-A-AH

## 2023-07-24 NOTE — ED Triage Notes (Signed)
 Pt arrived via POV after calling her PCP office and being advised to seek medical evaluation in the ER for persistent abdominal cramping X 2 weeks. Pt reports taking at home pregnancy test yesterday and it was positive.

## 2023-07-24 NOTE — ED Notes (Signed)
 Contacted the lab for an update on her urine pregnancy test. Lab says the results will be available in less than 10 minutes. MD updated.

## 2023-07-24 NOTE — ED Notes (Signed)
 Patient transported to Ultrasound via wheelchair in NAD.

## 2023-07-24 NOTE — Discharge Instructions (Addendum)
 The ultrasound is as follows:   IMPRESSION: Probable early intrauterine gestational sac and yolk sac, but no fetal pole, or cardiac activity yet visualized. Recommend follow-up quantitative B-HCG levels and follow-up US  in 14 days to assess viability. This recommendation follows SRU consensus guidelines: Diagnostic Criteria for Nonviable Pregnancy Early in the First Trimester.    Please have repeat hCG levels and ultrasound in 14 days.  Please follow-up with OB.   Noticed any kind of thick white vaginal discharge or any kind of fever or chills then please come back to the ED.   If you have any kind of worsening abdominal pain then please come at the ED.

## 2023-07-25 NOTE — Telephone Encounter (Signed)
 Patient advised to go to ed

## 2023-07-30 ENCOUNTER — Other Ambulatory Visit: Payer: Self-pay

## 2023-07-30 ENCOUNTER — Emergency Department (HOSPITAL_COMMUNITY)
Admission: EM | Admit: 2023-07-30 | Discharge: 2023-07-30 | Disposition: A | Discharge status: Home / Self Care | Attending: Emergency Medicine | Admitting: Emergency Medicine

## 2023-07-30 ENCOUNTER — Telehealth: Payer: Self-pay

## 2023-07-30 ENCOUNTER — Encounter (HOSPITAL_COMMUNITY): Payer: Self-pay | Admitting: Emergency Medicine

## 2023-07-30 DIAGNOSIS — O209 Hemorrhage in early pregnancy, unspecified: Secondary | ICD-10-CM | POA: Diagnosis not present

## 2023-07-30 DIAGNOSIS — O4690 Antepartum hemorrhage, unspecified, unspecified trimester: Secondary | ICD-10-CM

## 2023-07-30 DIAGNOSIS — J45909 Unspecified asthma, uncomplicated: Secondary | ICD-10-CM | POA: Diagnosis not present

## 2023-07-30 DIAGNOSIS — Z3A01 Less than 8 weeks gestation of pregnancy: Secondary | ICD-10-CM | POA: Diagnosis not present

## 2023-07-30 LAB — CBC
HCT: 34.2 % — ABNORMAL LOW (ref 36.0–46.0)
Hemoglobin: 11.4 g/dL — ABNORMAL LOW (ref 12.0–15.0)
MCH: 28.7 pg (ref 26.0–34.0)
MCHC: 33.3 g/dL (ref 30.0–36.0)
MCV: 86.1 fL (ref 80.0–100.0)
Platelets: 278 K/uL (ref 150–400)
RBC: 3.97 MIL/uL (ref 3.87–5.11)
RDW: 12.9 % (ref 11.5–15.5)
WBC: 5.7 K/uL (ref 4.0–10.5)
nRBC: 0 % (ref 0.0–0.2)

## 2023-07-30 LAB — HCG, QUANTITATIVE, PREGNANCY: hCG, Beta Chain, Quant, S: 23913 m[IU]/mL — ABNORMAL HIGH (ref <5)
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
## 2023-07-30 NOTE — ED Triage Notes (Signed)
 Pt c/o minimal vaginal bleeding that started about 30 minute pta. Pt states that she is about [redacted] weeks pregnant.

## 2023-07-30 NOTE — Telephone Encounter (Signed)
 Copied from CRM [id number]. Topic: Referral - Question >> Jul 30, 2023 10:48 AM Delon DASEN wrote: Reason for CRM: Patient asking if she needs an endocrinologist for pregnancy- please call [phone]

## 2023-07-30 NOTE — Discharge Instructions (Addendum)
 We evaluated you for your vaginal bleeding.  Your testing in the emergency department including your red blood cell count and your pregnancy hormone test are reassuring.  Your pregnancy hormone test is going up appropriately.  We would recommend following up closely with your OB as scheduled.  Vaginal bleeding this early in pregnancy could be normal, but it could also be a sign of a miscarriage.  If you notice any new or worsening symptoms such as large amounts of bleeding, lightheadedness or dizziness, fainting, abdominal pain or cramping, or any other new symptoms, please return to the emergency department.

## 2023-07-30 NOTE — Telephone Encounter (Signed)
 Spoke to patient , advised she needed to speak to her OBGYN since they are handling her pregnancy

## 2023-08-01 ENCOUNTER — Encounter (HOSPITAL_COMMUNITY): Payer: Self-pay

## 2023-08-01 ENCOUNTER — Other Ambulatory Visit: Payer: Self-pay

## 2023-08-01 ENCOUNTER — Inpatient Hospital Stay (HOSPITAL_COMMUNITY)
Admission: AD | Admit: 2023-08-01 | Discharge: 2023-08-01 | Disposition: A | Discharge status: Home / Self Care | Attending: Obstetrics & Gynecology | Admitting: Obstetrics & Gynecology

## 2023-08-01 ENCOUNTER — Inpatient Hospital Stay (HOSPITAL_COMMUNITY)

## 2023-08-01 DIAGNOSIS — O209 Hemorrhage in early pregnancy, unspecified: Secondary | ICD-10-CM

## 2023-08-01 DIAGNOSIS — Z3491 Encounter for supervision of normal pregnancy, unspecified, first trimester: Secondary | ICD-10-CM

## 2023-08-01 DIAGNOSIS — R109 Unspecified abdominal pain: Secondary | ICD-10-CM

## 2023-08-01 DIAGNOSIS — R1030 Lower abdominal pain, unspecified: Secondary | ICD-10-CM | POA: Diagnosis not present

## 2023-08-01 DIAGNOSIS — O26891 Other specified pregnancy related conditions, first trimester: Secondary | ICD-10-CM | POA: Diagnosis not present

## 2023-08-01 DIAGNOSIS — O418X10 Other specified disorders of amniotic fluid and membranes, first trimester, not applicable or unspecified: Secondary | ICD-10-CM

## 2023-08-01 DIAGNOSIS — Z3A01 Less than 8 weeks gestation of pregnancy: Secondary | ICD-10-CM

## 2023-08-01 DIAGNOSIS — O26892 Other specified pregnancy related conditions, second trimester: Secondary | ICD-10-CM | POA: Diagnosis not present

## 2023-08-01 DIAGNOSIS — O208 Other hemorrhage in early pregnancy: Secondary | ICD-10-CM | POA: Diagnosis not present

## 2023-08-01 DIAGNOSIS — O26899 Other specified pregnancy related conditions, unspecified trimester: Secondary | ICD-10-CM

## 2023-08-01 LAB — CBC
HCT: 34.1 % — ABNORMAL LOW (ref 36.0–46.0)
Hemoglobin: 11.6 g/dL — ABNORMAL LOW (ref 12.0–15.0)
MCH: 28.6 pg (ref 26.0–34.0)
MCHC: 34 g/dL (ref 30.0–36.0)
MCV: 84.2 fL (ref 80.0–100.0)
Platelets: 283 K/uL (ref 150–400)
RBC: 4.05 MIL/uL (ref 3.87–5.11)
RDW: 13.2 % (ref 11.5–15.5)
WBC: 4.6 K/uL (ref 4.0–10.5)
nRBC: 0 % (ref 0.0–0.2)

## 2023-08-01 LAB — URINALYSIS, ROUTINE W REFLEX MICROSCOPIC
Bacteria, UA: NONE SEEN
Bilirubin Urine: NEGATIVE
Glucose, UA: NEGATIVE mg/dL
Ketones, ur: 5 mg/dL — AB
Leukocytes,Ua: NEGATIVE
Nitrite: NEGATIVE
Protein, ur: NEGATIVE mg/dL
Specific Gravity, Urine: 1.02 (ref 1.005–1.030)
pH: 7 (ref 5.0–8.0)

## 2023-08-01 LAB — HCG, QUANTITATIVE, PREGNANCY: hCG, Beta Chain, Quant, S: 38575 m[IU]/mL — ABNORMAL HIGH (ref <5)

## 2023-08-01 MED ORDER — PRENATAL COMPLETE 14-0.4 MG PO TABS: 1.0000 | ORAL_TABLET | Freq: Every day | ORAL | 3 refills | Status: DC

## 2023-08-01 NOTE — MAU Note (Signed)
 Lori Briggs is a 23 y.o. at [redacted]w[redacted]d here in MAU reporting: on and off cramping mild, some mucous red discharge upon wiping after BR. C/o back pain. Seen for bleeding last week- US  done. First OB appt- 7/29, seen in [facility] for pregnancy conformation. No c/o HA, chest pain, no sex in last 48 hours  LMP: 06/18/23 Onset of complaint: this morning Pain score: 4/10- back Vitals:   08/01/23 1407  BP: 124/64  Pulse: 80  Resp: 16  Temp: 98.6 F (37 C)  SpO2: 100%     FHT: na  Lab orders placed from triage: ua

## 2023-08-01 NOTE — MAU Provider Note (Signed)
 [name] is a 23 y.o. G1P0 patient who presents to MAU today with complaint of lower abdominal cramping and some red mucoid discharge after using the bathroom.  Patient was seen last week in the ED on 7/15 where gestational sac was seen but no embryo or cardiac activity.  She denies any heavy vaginal bleeding and reports she has not had sexual relations for the last 48 hours.  LMP 06/18/2023.  She has her first prenatal appointment scheduled for 7/29 at [facility]   O BP 124/64 (BP Location: Left Arm)   Pulse 80   Temp 98.6 F (37 C) (Oral)   Resp 16   Ht 5' (1.524 m)   Wt 60.9 kg   LMP 06/18/2023 (Exact Date)   SpO2 100%   BMI 26.23 kg/m  Physical Exam Vitals and nursing note reviewed.  Constitutional:      Appearance: Normal appearance.  HENT:     Head: Normocephalic.     Nose: Nose normal.     Mouth/Throat:     Mouth: Mucous membranes are moist.  Cardiovascular:     Rate and Rhythm: Normal rate.  Pulmonary:     Effort: Pulmonary effort is normal.  Abdominal:     Palpations: Abdomen is soft.  Musculoskeletal:        General: Normal range of motion.     Cervical back: Normal range of motion.  Skin:    General: Skin is warm.  Neurological:     Mental Status: She is alert and oriented to person, place, and time.  Psychiatric:        Mood and Affect: Mood normal.        Behavior: Behavior normal.     MDM  HIGH  Vaginal bleeding/ abdominal cramping  in early pregnacy CBC: unremarkable HCG Quant: 38,575 ABO: A Positive OB Ultrasound:  IUP, with GS, YS, and fetal cardiac activity (a small subchorionic hemorrhage is noted) pregnancy progression noted from last ultrasound performed on 07/24/2023 UA: Unremarkable    Differential diagnosis considered for 1st trimester vaginal bleeding includes but is not limited to: ectopic pregnancy, complete spontaneous abortion, incomplete abortion, missed abortion, threatened abortion, embryonic/fetal demise, cervical  insufficiency, cervical or vaginal disorder    Orders Placed This Encounter  Procedures   US  OB Transvaginal    Standing Status:   Standing    Number of Occurrences:   1    Symptom/Reason for Exam:   Vaginal bleeding affecting early pregnancy [[id number]]   Urinalysis, Routine w reflex microscopic -Urine, Clean Catch    Standing Status:   Standing    Number of Occurrences:   1    Specimen Source:   Urine, Clean Catch [[id number]]   CBC    Standing Status:   Standing    Number of Occurrences:   1   hCG, quantitative, pregnancy    Standing Status:   Standing    Number of Occurrences:   1      Results for orders placed or performed during the hospital encounter of 08/01/23 (from the past 24 hours)  CBC     Status: Abnormal   Collection Time: 08/01/23  2:29 PM  Result Value Ref Range   WBC 4.6 4.0 - 10.5 K/uL   RBC 4.05 3.87 - 5.11 MIL/uL   Hemoglobin 11.6 (L) 12.0 - 15.0 g/dL   HCT 65.8 (L) 63.9 - 53.9 %   MCV 84.2 80.0 - 100.0 fL   MCH 28.6 26.0 - 34.0  pg   MCHC 34.0 30.0 - 36.0 g/dL   RDW 86.7 88.4 - 84.4 %   Platelets 283 150 - 400 K/uL   nRBC 0.0 0.0 - 0.2 %  Urinalysis, Routine w reflex microscopic -Urine, Clean Catch     Status: Abnormal   Collection Time: 08/01/23  2:37 PM  Result Value Ref Range   Color, Urine YELLOW YELLOW   APPearance HAZY (A) CLEAR   Specific Gravity, Urine 1.020 1.005 - 1.030   pH 7.0 5.0 - 8.0   Glucose, UA NEGATIVE NEGATIVE mg/dL   Hgb urine dipstick SMALL (A) NEGATIVE   Bilirubin Urine NEGATIVE NEGATIVE   Ketones, ur 5 (A) NEGATIVE mg/dL   Protein, ur NEGATIVE NEGATIVE mg/dL   Nitrite NEGATIVE NEGATIVE   Leukocytes,Ua NEGATIVE NEGATIVE   RBC / HPF 0-5 0 - 5 RBC/hpf   WBC, UA 0-5 0 - 5 WBC/hpf   Bacteria, UA NONE SEEN NONE SEEN   Squamous Epithelial / HPF 0-5 0 - 5 /HPF   Mucus PRESENT       Narrative & Impression  CLINICAL DATA:  Abdominal cramping, first trimester of pregnancy.   EXAM: TRANSVAGINAL OB ULTRASOUND    TECHNIQUE: Transvaginal ultrasound was performed for complete evaluation of the gestation as well as the maternal uterus, adnexal regions, and pelvic cul-de-sac.   COMPARISON:  July 24, 2022.   FINDINGS: Intrauterine gestational sac: Single   Yolk sac:  Visualized.   Embryo:  Visualized.   Cardiac Activity: Visualized.   Heart Rate: 116 bpm   CRL:   4.3 mm   6 w 1 d                  US  EDC: March 25, 2024.   Subchorionic hemorrhage:  Small subchorionic hemorrhage is noted.   Maternal uterus/adnexae: Ovaries are unremarkable. No free fluid is noted.   IMPRESSION: Single live intrauterine gestation of 6 weeks 1 day. Small subchorionic hemorrhage is noted.     Electronically Signed   By: Lynwood Landy Raddle M.D.   On: 08/01/2023 14:46    I have reviewed the patient chart and performed the physical exam . I have ordered & interpreted the lab results and reviewed and interpreted the ultrasound images and agree with the radiologist findings. A/P as described below.  Counseling and education provided and patient agreeable  with plan as described below. Verbalized understanding.    ASSESSMENT Medical screening exam complete  1. Normal intrauterine pregnancy on prenatal ultrasound in first trimester  2. Cramping affecting pregnancy, antepartum  3. Vaginal bleeding affecting early pregnancy (Primary)  4. Subchorionic hematoma in first trimester, single or unspecified fetus    PLAN Future Appointments  Date Time Provider Department Center  08/07/2023  1:10 PM [facility] INTAKE [facility] None  10/26/2023 10:20 AM Zarwolo, Gloria, FNP [facility]    Discharge from MAU in stable condition  See AVS for full description of educational information and instructions provided to the patient at time of discharge   Warning signs for worsening condition that would warrant emergency follow-up discussed Patient may return to MAU as needed   Littie Olam LABOR, NP 08/01/2023 3:22 PM

## 2023-08-01 NOTE — Discharge Instructions (Signed)

## 2023-08-07 ENCOUNTER — Other Ambulatory Visit (HOSPITAL_COMMUNITY)
Admission: RE | Admit: 2023-08-07 | Discharge: 2023-08-07 | Disposition: A | Discharge status: Home / Self Care | Source: Ambulatory Visit | Attending: Physician Assistant | Admitting: Physician Assistant

## 2023-08-07 ENCOUNTER — Other Ambulatory Visit (INDEPENDENT_AMBULATORY_CARE_PROVIDER_SITE_OTHER): Payer: Self-pay

## 2023-08-07 ENCOUNTER — Ambulatory Visit (INDEPENDENT_AMBULATORY_CARE_PROVIDER_SITE_OTHER): Admitting: *Deleted

## 2023-08-07 VITALS — BP 108/69 | HR 66 | Wt 144.0 lb

## 2023-08-07 DIAGNOSIS — Z3A01 Less than 8 weeks gestation of pregnancy: Secondary | ICD-10-CM

## 2023-08-07 DIAGNOSIS — Z3401 Encounter for supervision of normal first pregnancy, first trimester: Secondary | ICD-10-CM

## 2023-08-07 DIAGNOSIS — Z3480 Encounter for supervision of other normal pregnancy, unspecified trimester: Secondary | ICD-10-CM | POA: Insufficient documentation

## 2023-08-07 DIAGNOSIS — Z1331 Encounter for screening for depression: Secondary | ICD-10-CM | POA: Diagnosis not present

## 2023-08-07 DIAGNOSIS — Z362 Encounter for other antenatal screening follow-up: Secondary | ICD-10-CM

## 2023-08-07 MED ORDER — BLOOD PRESSURE KIT DEVI
1.0000 | 0 refills | Status: AC
Start: 2023-08-07 — End: ?

## 2023-08-07 NOTE — Patient Instructions (Signed)
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
## 2023-08-08 LAB — CERVICOVAGINAL ANCILLARY ONLY
Chlamydia: NEGATIVE
Comment: NEGATIVE
Comment: NORMAL
Neisseria Gonorrhea: NEGATIVE

## 2023-08-15 ENCOUNTER — Telehealth: Admitting: Obstetrics

## 2023-08-15 ENCOUNTER — Encounter: Payer: Self-pay | Admitting: Obstetrics

## 2023-08-15 DIAGNOSIS — O468X1 Other antepartum hemorrhage, first trimester: Secondary | ICD-10-CM | POA: Diagnosis not present

## 2023-08-15 DIAGNOSIS — Z3480 Encounter for supervision of other normal pregnancy, unspecified trimester: Secondary | ICD-10-CM

## 2023-08-15 DIAGNOSIS — Z3A08 8 weeks gestation of pregnancy: Secondary | ICD-10-CM

## 2023-08-15 DIAGNOSIS — O468X9 Other antepartum hemorrhage, unspecified trimester: Secondary | ICD-10-CM
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
## 2023-08-15 NOTE — Progress Notes (Unsigned)
 Pt presents for [facility]

## 2023-08-20 ENCOUNTER — Encounter

## 2023-08-20 ENCOUNTER — Other Ambulatory Visit: Payer: Self-pay

## 2023-08-20 MED ORDER — PRENATAL COMPLETE 14-0.4 MG PO TABS: 1.0000 | ORAL_TABLET | Freq: Every day | ORAL | 3 refills | Status: AC

## 2023-09-05 ENCOUNTER — Ambulatory Visit (INDEPENDENT_AMBULATORY_CARE_PROVIDER_SITE_OTHER): Admitting: Physician Assistant

## 2023-09-05 VITALS — BP 114/70 | HR 88 | Wt 139.0 lb

## 2023-09-05 DIAGNOSIS — Z3481 Encounter for supervision of other normal pregnancy, first trimester: Secondary | ICD-10-CM | POA: Diagnosis not present

## 2023-09-05 DIAGNOSIS — Z3A11 11 weeks gestation of pregnancy: Secondary | ICD-10-CM

## 2023-09-05 MED ORDER — ASPIRIN 81 MG PO TBEC: 81.0000 mg | DELAYED_RELEASE_TABLET | Freq: Every day | ORAL | 12 refills | Status: AC

## 2023-09-05 MED ORDER — PRENATAL GUMMIES 0.18-25 MG PO CHEW: 1.0000 | CHEWABLE_TABLET | Freq: Every day | ORAL | 5 refills | Status: AC

## 2023-09-05 NOTE — Progress Notes (Signed)
 Pt presents for new ob. Pt is having cramping. Pt has no other questions or concerns at this time.
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
## 2023-09-06 LAB — CBC/D/PLT+RPR+RH+ABO+RUBIGG...
Antibody Screen: NEGATIVE
Basophils Absolute: 0 x10E3/uL (ref 0.0–0.2)
Basos: 1 %
EOS (ABSOLUTE): 0 x10E3/uL (ref 0.0–0.4)
Eos: 0 %
HCV Ab: NONREACTIVE
HIV Screen 4th Generation wRfx: NONREACTIVE
Hematocrit: 35.5 % (ref 34.0–46.6)
Hemoglobin: 11.6 g/dL (ref 11.1–15.9)
Hepatitis B Surface Ag: NEGATIVE
Immature Grans (Abs): 0 x10E3/uL (ref 0.0–0.1)
Immature Granulocytes: 0 %
Lymphocytes Absolute: 1 x10E3/uL (ref 0.7–3.1)
Lymphs: 21 %
MCH: 29.5 pg (ref 26.6–33.0)
MCHC: 32.7 g/dL (ref 31.5–35.7)
MCV: 90 fL (ref 79–97)
Monocytes Absolute: 0.3 x10E3/uL (ref 0.1–0.9)
Monocytes: 7 %
Neutrophils Absolute: 3.6 x10E3/uL (ref 1.4–7.0)
Neutrophils: 71 %
Platelets: 227 x10E3/uL (ref 150–450)
RBC: 3.93 x10E6/uL (ref 3.77–5.28)
RDW: 14.5 % (ref 11.7–15.4)
RPR Ser Ql: NONREACTIVE
Rh Factor: POSITIVE
Rubella Antibodies, IGG: 1.37 {index} (ref >0.99)
WBC: 5.1 x10E3/uL (ref 3.4–10.8)

## 2023-09-06 LAB — HEMOGLOBIN A1C
Est. average glucose Bld gHb Est-mCnc: 105 mg/dL
Hgb A1c MFr Bld: 5.3 % (ref 4.8–5.6)

## 2023-09-06 LAB — HCV INTERPRETATION

## 2023-09-11 LAB — PANORAMA PRENATAL TEST FULL PANEL:PANORAMA TEST PLUS 5 ADDITIONAL MICRODELETIONS: FETAL FRACTION: 16.6

## 2023-09-12 ENCOUNTER — Ambulatory Visit: Payer: Self-pay | Admitting: Physician Assistant

## 2023-09-14 ENCOUNTER — Encounter: Payer: Self-pay | Admitting: Physician Assistant

## 2023-09-14 DIAGNOSIS — D563 Thalassemia minor: Secondary | ICD-10-CM | POA: Insufficient documentation

## 2023-09-14 LAB — HORIZON CUSTOM: REPORT SUMMARY: POSITIVE — AB

## 2023-09-17 ENCOUNTER — Telehealth: Payer: Self-pay

## 2023-09-17 NOTE — Telephone Encounter (Signed)
 Attempted to contact about genetic partner testing kit, no answer, left vm

## 2023-09-17 NOTE — Telephone Encounter (Signed)
 S/w pt and advised that partner kit will be left at front desk for her to pick up.

## 2023-09-19 ENCOUNTER — Other Ambulatory Visit: Payer: Self-pay

## 2023-09-19 DIAGNOSIS — Z3482 Encounter for supervision of other normal pregnancy, second trimester: Secondary | ICD-10-CM | POA: Diagnosis not present

## 2023-09-19 DIAGNOSIS — Z3483 Encounter for supervision of other normal pregnancy, third trimester: Secondary | ICD-10-CM | POA: Diagnosis not present

## 2023-09-19 DIAGNOSIS — D563 Thalassemia minor: Secondary | ICD-10-CM

## 2023-09-28 ENCOUNTER — Encounter: Payer: Self-pay | Admitting: *Deleted

## 2023-10-03 ENCOUNTER — Ambulatory Visit (INDEPENDENT_AMBULATORY_CARE_PROVIDER_SITE_OTHER): Admitting: Physician Assistant

## 2023-10-03 ENCOUNTER — Encounter: Payer: Self-pay | Admitting: Physician Assistant

## 2023-10-03 VITALS — BP 122/73 | HR 112 | Wt 141.4 lb

## 2023-10-03 DIAGNOSIS — Z3480 Encounter for supervision of other normal pregnancy, unspecified trimester: Secondary | ICD-10-CM

## 2023-10-03 DIAGNOSIS — Z3A15 15 weeks gestation of pregnancy: Secondary | ICD-10-CM

## 2023-10-03 NOTE — Progress Notes (Signed)
 Pt states her ankles tend to be swollen in the morning; she states she is on her feet all day at work which she believes attributes to this.   Would like to discuss the need of daily aspirin  with provider.
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
## 2023-10-26 ENCOUNTER — Ambulatory Visit: Admitting: Family Medicine

## 2023-10-31 ENCOUNTER — Ambulatory Visit: Admitting: Physician Assistant

## 2023-10-31 ENCOUNTER — Other Ambulatory Visit

## 2023-10-31 VITALS — BP 124/74 | HR 89 | Wt 152.7 lb

## 2023-10-31 DIAGNOSIS — Z3A19 19 weeks gestation of pregnancy: Secondary | ICD-10-CM

## 2023-10-31 DIAGNOSIS — Z3402 Encounter for supervision of normal first pregnancy, second trimester: Secondary | ICD-10-CM | POA: Diagnosis not present

## 2023-10-31 NOTE — Progress Notes (Signed)
   PRENATAL VISIT NOTE  Subjective:  Lori Briggs is a 23 y.o. G1P0000 at [redacted]w[redacted]d being seen today for ongoing prenatal care.  She is currently monitored for the following issues for this low-risk pregnancy and has Asthma, mild intermittent; Insulin resistance; Hyperinsulinemia; Acanthosis nigricans, acquired; Female hirsutism; Goiter; Scoliosis; History of prediabetes; Cervical cancer screening; CAP (community acquired pneumonia); Supervision of other normal pregnancy, antepartum; and Alpha thalassemia silent carrier on their problem list.  Patient reports no complaints.  Contractions: Not present. Vag. Bleeding: None.  Movement: Present. Denies leaking of fluid.   The following portions of the patient's history were reviewed and updated as appropriate: allergies, current medications, past family history, past medical history, past social history, past surgical history and problem list.   Objective:    Vitals:   10/31/23 1109  BP: 124/74  Pulse: 89  Weight: 152 lb 11.2 oz (69.3 kg)    Fetal Status:  Fetal Heart Rate (bpm): 152 Fundal Height: 19 cm Movement: Present    General: Alert, oriented and cooperative. Patient is in no acute distress.  Skin: Skin is warm and dry. No rash noted.   Cardiovascular: Normal heart rate noted  Respiratory: Normal respiratory effort, no problems with respiration noted  Abdomen: Soft, gravid, appropriate for gestational age.  Pain/Pressure: Present     Pelvic: Cervical exam deferred        Extremities: Normal range of motion.  Edema: None  Mental Status: Normal mood and affect. Normal behavior. Normal judgment and thought content.   Assessment and Plan:  Pregnancy: G1P0000 at [redacted]w[redacted]d  1. Encounter for supervision of normal first pregnancy in second trimester (Primary) Patient doing well, feeling regular fetal movement BP, FHR, FH appropriate  - AFP, Serum, Open Spina Bifida  2. [redacted] weeks gestation of pregnancy Anticipatory guidance about next  visits/weeks of pregnancy given.  11/05/23 anatomy scan   Preterm labor symptoms and general obstetric precautions including but not limited to vaginal bleeding, contractions, leaking of fluid and fetal movement were reviewed in detail with the patient.  Please refer to After Visit Summary for other counseling recommendations.   Return in about 4 weeks (around 11/28/2023).  Future Appointments  Date Time Provider Department Center  11/05/2023  1:00 PM [facility] PROVIDER 1 [facility]  11/05/2023  1:30 PM [facility] US3 [facility]  11/05/2023  2:30 PM [facility] GENETIC COUNSELING RM [facility]  11/28/2023 10:55 AM Delores Nidia CROME, FNP [facility] None  02/20/2024  9:20 AM Bacchus, Meade PEDLAR, FNP [facility] [address] PA-C

## 2023-10-31 NOTE — Progress Notes (Signed)
 Pt presents for rob. Pt has no questions or concerns at this time.

## 2023-11-03 LAB — AFP, SERUM, OPEN SPINA BIFIDA
AFP Value: 59.3 ng/mL
Gest. Age on Collection Date: 19 wk
Maternal Age At EDD: 22.3 a

## 2023-11-05 ENCOUNTER — Ambulatory Visit: Attending: Obstetrics and Gynecology | Admitting: Obstetrics

## 2023-11-05 ENCOUNTER — Ambulatory Visit (HOSPITAL_BASED_OUTPATIENT_CLINIC_OR_DEPARTMENT_OTHER)

## 2023-11-05 VITALS — BP 109/57 | HR 94

## 2023-11-05 DIAGNOSIS — O2692 Pregnancy related conditions, unspecified, second trimester: Secondary | ICD-10-CM | POA: Diagnosis not present

## 2023-11-05 DIAGNOSIS — Z363 Encounter for antenatal screening for malformations: Secondary | ICD-10-CM | POA: Diagnosis present

## 2023-11-05 DIAGNOSIS — Z3689 Encounter for other specified antenatal screening: Secondary | ICD-10-CM | POA: Insufficient documentation

## 2023-11-05 DIAGNOSIS — D563 Thalassemia minor: Secondary | ICD-10-CM | POA: Diagnosis not present

## 2023-11-05 DIAGNOSIS — Z3A20 20 weeks gestation of pregnancy: Secondary | ICD-10-CM | POA: Diagnosis not present

## 2023-11-05 DIAGNOSIS — O99810 Abnormal glucose complicating pregnancy: Secondary | ICD-10-CM | POA: Diagnosis not present

## 2023-11-05 DIAGNOSIS — Z148 Genetic carrier of other disease: Secondary | ICD-10-CM | POA: Insufficient documentation

## 2023-11-05 DIAGNOSIS — Z3480 Encounter for supervision of other normal pregnancy, unspecified trimester: Secondary | ICD-10-CM

## 2023-11-05 DIAGNOSIS — O99012 Anemia complicating pregnancy, second trimester: Secondary | ICD-10-CM

## 2023-11-05 DIAGNOSIS — O289 Unspecified abnormal findings on antenatal screening of mother: Secondary | ICD-10-CM

## 2023-11-05 NOTE — Progress Notes (Signed)
 [facility] at [facility] [address] Phone:  [phone]   Fax:  [phone]      In-Person Genetic Counseling Clinic Note:   I spoke with 23 y.o. Lori Briggs today to discuss her carrier screening results. She was referred by Abigail Rollo DASEN, MD. She was accompanied by her mother.   Pregnancy History:    G1P0. EGA: [redacted]w[redacted]d by LMP. EDD: 03/24/2024. Denies major personal health concerns. Reports a history of ovarian cysts but no diagnosis of PCOS. Reports early [facility]. Denies bleeding, infections, and fevers in this pregnancy. Denies using tobacco, alcohol, or street drugs in this pregnancy.   Family History:    A three-generation pedigree was created and scanned into Epic under the Media tab.  Patient reports her mother and her deceased brother both had sickle cell trait. Parrie had negative carrier screening for sickle cell disease; however, without review of reports it is possible that another hemoglobinopathy may be present and not included on the currently available carrier screening.  Patient reports her two sisters also have a history of ovarian cysts but no diagnosis of PCOS.  Patient reports a sister who has a 18 yo son diagnosed with autism; he is healthy. No known genetic testing performed. We are unable to directly test for autism in a pregnancy. Genetic testing for individuals with a clinical diagnosis of autism yields an explanation in only about 30% of cases, and the remaining 70% of cases are left with unknown etiology. Having an affected family member may increase the chance that Anah's children will have autism; however, without genetic testing performed on affected family members, it is difficult to assess risk to the pregnancy and other family members. The risk may be up to 50% in the case of an identified genetic cause. We also discussed and offered screening for fragile X syndrome, which is one of the most  common causes of inherited intellectual disability and autism in males. Fragile X syndrome affects females as well. Clariece declined fragile X syndrome carrier screening.  Patient reports her sister also as an 60 yo son who was born with physical differences in both kidneys. He is s/p surgery at 25 months of age and currently needs a catheter. This was prenatally diagnosed. No known etiology.  Patient's brother passed away at 43 yo due to complications of an oligodendroglioma that was found on an autopsy. He had a history of migraines and seizures. No known etiology.  Patient reports FOB's mother had a stillborn son at 37 mo gestation. No known etiology.  Patient reports FOB's paternal half-sister d. ~10 yo from ?cerebral pals and a heart condition. No other information is known.  We discussed that each of these findings may be due to genetic, multifactorial, or environmental causes. Without additional information, recurrence risk is difficult to estimate. The family is encouraged to reach out with clarification if available.  Patient ethnicity reported as Black and FOB ethnicity reported as [place]. Denies Ashkenazi Jewish ancestry.  Family history otherwise not remarkable for consanguinity, individuals with birth defects, intellectual disability, autism spectrum disorder, multiple spontaneous abortions, still births, or unexplained neonatal death.   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 She screened negative for the other three conditions (CF, SMA, and beta-hemoglobinopathies) which significantly reduces but does not eliminate the chance of  being a carrier for those conditions. Please see report for details.  We reviewed the genetics of alpha thalassemia, autosomal recessive mode of inheritance, and clinical features of these conditions. We reviewed that Ziona  will either pass down two copies of the alpha globin gene (??) OR one copy of the alpha globin gene (-?) in each pregnancy. Therefore, this pregnancy is not at increased risk for hemoglobin Bart's due to four deletions of the alpha globin genes (--/--) regardless of her reproductive partner's carrier status.  FOB negative carrier screening: FOB Honor previously had carrier screening that returned as negative for alpha thalassemia, CF, SMA, and beta-hemoglobinopathies. Therefore, this couples current and future pregnancies have a very low risk of developing these conditions. Per the couple's results, we expect their pregnancies to have a 50% chance of also being a silent carrier for alpha thalassemia (aa/-a) or a 50% chance of being unaffected and not a carrier (aa/aa). There is no increased risk for fetal alpha thalassemia.  Newborn Screening. The [place]  Newborn Screening (NBS) program will screen all newborn babies for cystic fibrosis, spinal muscular atrophy, hemoglobinopathies, and numerous other conditions. This does not screen for alpha thalassemia.  Previous Testing Completed:  Low risk NIPS: Eydie previously completed [facility] noninvasive prenatal screening (NIPS) in this pregnancy. The result is low risk, consistent with a female fetus. This screening significantly reduces but does not eliminate the chance that the current pregnancy has Down syndrome (trisomy 66), trisomy 33, trisomy 48, common sex chromosome conditions, and 22q11.2 microdeletion syndrome. Please see report for details. There are many genetic conditions that cannot be detected by NIPS.   Negative ms-AFP screening: Mazikeen previously completed a maternal serum AFP screen in this pregnancy. The result is screen negative. Please see report for details. A negative result reduces the risk that the current pregnancy has an open neural tube defect. Closed neural tube defects and some open defects may not be detected by this  screen.   Plan of Care:   Declined amniocentesis. Routine prenatal care.   Informed consent was obtained. All questions were answered.   70 minutes were spent on the date of the encounter in service to the patient including preparation, face-to-face consultation, discussion of test reports and available next steps, pedigree construction, genetic risk assessment, documentation, and care coordination.    Thank you for sharing in the care of Lori Briggs with us .  Please do not hesitate to contact us  at [phone] if you have any questions.   Lauraine Bodily, MS, [facility] Certified Genetic Counselor   Genetic counseling student involved in appointment: Yes Bartlett SAUNDERS., CHERRI).

## 2023-11-05 NOTE — Progress Notes (Signed)
 MFM Consult Note  Lori Briggs is currently at 20 weeks and 0 days.  She was seen for a detailed fetal anatomy scan as her Horizon screening test indicated that she is a silent carrier for alpha thalassemia.  She reports that the FOB was screened and he was found not to be a carrier for alpha thalassemia.  She denies any significant past medical history and denies any problems in her current pregnancy.    She had a cell free DNA test earlier in her pregnancy which indicated a low risk for trisomy 69, 13, and 13. A female fetus is predicted.  Sonographic findings Single intrauterine pregnancy at 20w 0d. Fetal cardiac activity:  Observed and appears normal. Presentation: Breech. The anatomic structures that were well seen appear normal without evidence of soft markers. The anatomic survey is complete.  Fetal biometry shows the estimated fetal weight at the 82nd percentile. Amniotic fluid: Within normal limits.  MVP: 7.31 cm. Placenta: Anterior. Adnexa: No abnormality visualized. Cervical length: 3.6 cm.  The patient was informed that anomalies may be missed due to technical limitations. If the fetus is in a suboptimal position or maternal habitus is increased, visualization of the fetus in the maternal uterus may be impaired.  Horizon test indicating that she is a silent carrier for alpha thalassemia The patient was advised that alpha thalassemia is inherited in an autosomal recessive fashion.  She was advised that as her partner tested negative as a carrier for alpha thalassemia, it is unlikely that their child will be affected with the disease. Should both parents be carriers for this condition, their child may have a 25% chance of inheriting the disease.   She understands that an amniocentesis is available for definitive prenatal diagnosis.  The patient met with our genetic counselor following today's ultrasound exam to discuss the significance of being a silent carrier for alpha  thalassemia.    As the views of the fetal anatomy were visualized today, no further exams were scheduled in our office.    The patient stated that all of her questions were answered today.  A total of 30 minutes was spent counseling and coordinating the care for this patient.  Greater than 50% of the time was spent in direct face-to-face contact.

## 2023-11-21 ENCOUNTER — Ambulatory Visit: Payer: Self-pay

## 2023-11-28 ENCOUNTER — Encounter: Admitting: Obstetrics and Gynecology

## 2023-11-29 ENCOUNTER — Encounter: Payer: Self-pay | Admitting: Obstetrics and Gynecology

## 2023-11-29 ENCOUNTER — Ambulatory Visit (INDEPENDENT_AMBULATORY_CARE_PROVIDER_SITE_OTHER): Admitting: Obstetrics and Gynecology

## 2023-11-29 VITALS — BP 103/69 | HR 114 | Wt 162.0 lb

## 2023-11-29 DIAGNOSIS — Z3480 Encounter for supervision of other normal pregnancy, unspecified trimester: Secondary | ICD-10-CM | POA: Diagnosis not present

## 2023-11-29 NOTE — Progress Notes (Signed)
 PRENATAL VISIT NOTE  Subjective:  Lori Briggs is a 23 y.o. G1P0000 at [redacted]w[redacted]d being seen today for ongoing prenatal care.  She is currently monitored for the following issues for this low-risk pregnancy and has Asthma, mild intermittent; Insulin resistance; Hyperinsulinemia; Acanthosis nigricans, acquired; Female hirsutism; Goiter; Scoliosis; History of prediabetes; Cervical cancer screening; CAP (community acquired pneumonia); Supervision of other normal pregnancy, antepartum; and Alpha thalassemia silent carrier on their problem list.  Patient reports no complaints.  Contractions: Not present. Vag. Bleeding: None.  Movement: Present. Denies leaking of fluid.   The following portions of the patient's history were reviewed and updated as appropriate: allergies, current medications, past family history, past medical history, past social history, past surgical history and problem list.   Objective:   Vitals:   11/29/23 1109  BP: 103/69  Pulse: (!) 114  Weight: 162 lb (73.5 kg)    Fetal Status:  Fetal Heart Rate (bpm): 150 Fundal Height: 23 cm Movement: Present    General: Alert, oriented and cooperative. Patient is in no acute distress.  Skin: Skin is warm and dry. No rash noted.   Cardiovascular: Normal heart rate noted  Respiratory: Normal respiratory effort, no problems with respiration noted  Abdomen: Soft, gravid, appropriate for gestational age.  Pain/Pressure: Absent     Pelvic: Cervical exam deferred        Extremities: Normal range of motion.     Mental Status: Normal mood and affect. Normal behavior. Normal judgment and thought content.      09/05/2023   11:15 AM 08/07/2023    1:46 PM 05/31/2023    9:23 AM  Depression screen PHQ 2/9  Decreased Interest 1 0 0  Down, Depressed, Hopeless 0 0 0  PHQ - 2 Score 1 0 0  Altered sleeping 2 0 0  Tired, decreased energy 2 0 0  Change in appetite 0 0 0  Feeling bad or failure about yourself  0 0 0  Trouble concentrating 0 0 0   Moving slowly or fidgety/restless 0 0 0  Suicidal thoughts 0 0 0  PHQ-9 Score 5  0  0   Difficult doing work/chores   Not difficult at all     Data saved with a previous flowsheet row definition        09/05/2023   11:16 AM 08/07/2023    1:47 PM 05/31/2023    9:23 AM 04/26/2023   10:17 AM  GAD 7 : Generalized Anxiety Score  Nervous, Anxious, on Edge 1 1 0 0  Control/stop worrying 1 1 0 0  Worry too much - different things 0 1 0 0  Trouble relaxing 0 0 0 0  Restless 0 0 0 0  Easily annoyed or irritable 2 2 0 0  Afraid - awful might happen 1 0 0 0  Total GAD 7 Score 5 5 0 0  Anxiety Difficulty   Not difficult at all Not difficult at all    Assessment and Plan:  Pregnancy: G1P0000 at [redacted]w[redacted]d 1. Supervision of other normal pregnancy, antepartum (Primary) Patient is doing well without complaints Third trimester labs and glucola next visit  Preterm labor symptoms and general obstetric precautions including but not limited to vaginal bleeding, contractions, leaking of fluid and fetal movement were reviewed in detail with the patient. Please refer to After Visit Summary for other counseling recommendations.   Return in about 4 weeks (around 12/27/2023) for in person, ROB, Low risk, 2 hr glucola next visit.  Future Appointments  Date Time Provider Department Center  02/20/2024  9:20 AM Bacchus, Meade PEDLAR, FNP [facility] [address]    Winton Felt, MD

## 2023-11-29 NOTE — Progress Notes (Signed)
 Pt request refill on Zyrtec  and Flonase .  Pt complains of increase in HA's, use Tylenol .

## 2023-12-28 ENCOUNTER — Ambulatory Visit: Payer: Self-pay | Admitting: Obstetrics and Gynecology

## 2023-12-28 ENCOUNTER — Other Ambulatory Visit

## 2023-12-28 VITALS — BP 111/70 | HR 88 | Wt 172.2 lb

## 2023-12-28 DIAGNOSIS — Z3A27 27 weeks gestation of pregnancy: Secondary | ICD-10-CM

## 2023-12-28 DIAGNOSIS — Z3480 Encounter for supervision of other normal pregnancy, unspecified trimester: Secondary | ICD-10-CM | POA: Diagnosis not present

## 2023-12-28 NOTE — Progress Notes (Signed)
" ° °  PRENATAL VISIT NOTE  Subjective:  Lori Briggs is a 23 y.o. G1P0000 at [redacted]w[redacted]d being seen today for ongoing prenatal care.  She is currently monitored for the following issues for this low-risk pregnancy and has Asthma, mild intermittent; Insulin resistance; Hyperinsulinemia; Acanthosis nigricans, acquired; Female hirsutism; Goiter; Scoliosis; History of prediabetes; CAP (community acquired pneumonia); Supervision of other normal pregnancy, antepartum; and Alpha thalassemia silent carrier on their problem list.  Patient reports no complaints.  Contractions: Not present. Vag. Bleeding: None.  Movement: Present. Denies leaking of fluid.   The following portions of the patient's history were reviewed and updated as appropriate: allergies, current medications, past family history, past medical history, past social history, past surgical history and problem list.   Objective:   Vitals:   12/28/23 0905  BP: 111/70  Pulse: 88  Weight: 172 lb 3.2 oz (78.1 kg)    Fetal Status:  Fetal Heart Rate (bpm): 152 Fundal Height: 28 cm Movement: Present    General: Alert, oriented and cooperative. Patient is in no acute distress.  Skin: Skin is warm and dry. No rash noted.   Cardiovascular: Normal heart rate noted  Respiratory: Normal respiratory effort, no problems with respiration noted  Abdomen: Soft, gravid, appropriate for gestational age.  Pain/Pressure: Present     Pelvic: Cervical exam deferred        Extremities: Normal range of motion.  Edema: None  Mental Status: Normal mood and affect. Normal behavior. Normal judgment and thought content.   Assessment and Plan:  Pregnancy: G1P0000 at [redacted]w[redacted]d 1. Supervision of other normal pregnancy, antepartum (Primary) BP and FHR normal Doing well, feeling regular movement  FH appropriate  2. [redacted] weeks gestation of pregnancy GTT and labs today  Declined Tdap  Desires to meet with lactation prior to delivery, message sent  [facility] in  [place]   Preterm labor symptoms and general obstetric precautions including but not limited to vaginal bleeding, contractions, leaking of fluid and fetal movement were reviewed in detail with the patient. Please refer to After Visit Summary for other counseling recommendations.   Return in two weeks for LOB    Nidia Daring, FNP  "

## 2023-12-28 NOTE — Progress Notes (Signed)
 Pt presents for ROB visit. Declines Tdap and flu vaccines today.

## 2023-12-29 LAB — CBC
Hematocrit: 30 % — ABNORMAL LOW (ref 34.0–46.6)
Hemoglobin: 9.9 g/dL — ABNORMAL LOW (ref 11.1–15.9)
MCH: 29.1 pg (ref 26.6–33.0)
MCHC: 33 g/dL (ref 31.5–35.7)
MCV: 88 fL (ref 79–97)
Platelets: 212 x10E3/uL (ref 150–450)
RBC: 3.4 x10E6/uL — ABNORMAL LOW (ref 3.77–5.28)
RDW: 13.1 % (ref 11.7–15.4)
WBC: 5.6 x10E3/uL (ref 3.4–10.8)

## 2023-12-29 LAB — GLUCOSE TOLERANCE, 2 HOURS W/ 1HR
Glucose, 1 hour: 73 mg/dL (ref 70–179)
Glucose, 2 hour: 74 mg/dL (ref 70–152)
Glucose, Fasting: 73 mg/dL (ref 70–91)

## 2023-12-29 LAB — SYPHILIS: RPR W/REFLEX TO RPR TITER AND TREPONEMAL ANTIBODIES, TRADITIONAL SCREENING AND DIAGNOSIS ALGORITHM: RPR Ser Ql: NONREACTIVE

## 2023-12-29 LAB — HIV ANTIBODY (ROUTINE TESTING W REFLEX): HIV Screen 4th Generation wRfx: NONREACTIVE

## 2023-12-31 ENCOUNTER — Telehealth: Payer: Self-pay

## 2023-12-31 ENCOUNTER — Ambulatory Visit: Payer: Self-pay | Admitting: Obstetrics and Gynecology

## 2023-12-31 DIAGNOSIS — O99019 Anemia complicating pregnancy, unspecified trimester: Secondary | ICD-10-CM

## 2023-12-31 DIAGNOSIS — Z3480 Encounter for supervision of other normal pregnancy, unspecified trimester: Secondary | ICD-10-CM

## 2023-12-31 MED ORDER — FERROUS SULFATE 325 (65 FE) MG PO TABS: 325.0000 mg | ORAL_TABLET | ORAL | 1 refills | Status: AC

## 2023-12-31 NOTE — Telephone Encounter (Signed)
 Calling [facility] per referral from provider. Patient interested in prenatal breastfeeding support. Discussed upcoming [facility] Prenatal Lactation class. Will contact closer to date of first class.  Alexsia Klindt BS, IBCLC

## 2023-12-31 NOTE — Telephone Encounter (Signed)
 Calling Lori Briggs per referral from provider. Patient interested in prenatal breastfeeding support. Called once, no answer, left message. Follow up as needed.  Lori Briggs IBCLC

## 2024-01-10 DIAGNOSIS — U071 COVID-19: Secondary | ICD-10-CM

## 2024-01-10 HISTORY — DX: COVID-19: U07.1

## 2024-01-16 ENCOUNTER — Encounter: Payer: Self-pay | Admitting: Family Medicine

## 2024-01-17 ENCOUNTER — Ambulatory Visit (INDEPENDENT_AMBULATORY_CARE_PROVIDER_SITE_OTHER): Admitting: Obstetrics

## 2024-01-17 VITALS — BP 122/69 | HR 88 | Wt 170.0 lb

## 2024-01-17 DIAGNOSIS — D563 Thalassemia minor: Secondary | ICD-10-CM | POA: Diagnosis not present

## 2024-01-17 DIAGNOSIS — Z3A30 30 weeks gestation of pregnancy: Secondary | ICD-10-CM

## 2024-01-17 DIAGNOSIS — Z3480 Encounter for supervision of other normal pregnancy, unspecified trimester: Secondary | ICD-10-CM

## 2024-01-17 DIAGNOSIS — Z87898 Personal history of other specified conditions: Secondary | ICD-10-CM | POA: Diagnosis not present

## 2024-01-17 DIAGNOSIS — Z3403 Encounter for supervision of normal first pregnancy, third trimester: Secondary | ICD-10-CM

## 2024-01-17 DIAGNOSIS — E88819 Insulin resistance, unspecified: Secondary | ICD-10-CM

## 2024-01-17 NOTE — Progress Notes (Signed)
 Pt presents for rob. Pt has no questions or concerns at this time.

## 2024-01-17 NOTE — Progress Notes (Signed)
 Subjective:  Lori Briggs is a 24 y.o. G1P0000 at [redacted]w[redacted]d being seen today for ongoing prenatal care.  She is currently monitored for the following issues for this low-risk pregnancy and has Asthma, mild intermittent; Insulin resistance; Hyperinsulinemia; Acanthosis nigricans, acquired; Female hirsutism; Goiter; Scoliosis; History of prediabetes; CAP (community acquired pneumonia); Supervision of other normal pregnancy, antepartum; and Alpha thalassemia silent carrier on their problem list.  Patient reports no complaints.   .  .   . Denies leaking of fluid.   The following portions of the patient's history were reviewed and updated as appropriate: allergies, current medications, past family history, past medical history, past social history, past surgical history and problem list. Problem list updated.  Objective:  There were no vitals filed for this visit.  Fetal Status:           General:  Alert, oriented and cooperative. Patient is in no acute distress.  Skin: Skin is warm and dry. No rash noted.   Cardiovascular: Normal heart rate noted  Respiratory: Normal respiratory effort, no problems with respiration noted  Abdomen: Soft, gravid, appropriate for gestational age.       Pelvic:  Cervical exam deferred        Extremities: Normal range of motion.     Mental Status: Normal mood and affect. Normal behavior. Normal judgment and thought content.   Urinalysis:      Assessment and Plan:  Pregnancy: G1P0000 at [redacted]w[redacted]d  1. Supervision of other normal pregnancy, antepartum (Primary)  2. History of prediabetes  3. Alpha thalassemia silent carrier  4. Insulin resistance   Preterm labor symptoms and general obstetric precautions including but not limited to vaginal bleeding, contractions, leaking of fluid and fetal movement were reviewed in detail with the patient. Please refer to After Visit Summary for other counseling recommendations.    Rudy Carlin DELENA, MD 01/17/2024

## 2024-01-22 ENCOUNTER — Encounter: Payer: Self-pay | Admitting: Internal Medicine

## 2024-01-22 ENCOUNTER — Ambulatory Visit (INDEPENDENT_AMBULATORY_CARE_PROVIDER_SITE_OTHER): Admitting: Internal Medicine

## 2024-01-22 ENCOUNTER — Telehealth: Payer: Self-pay | Admitting: *Deleted

## 2024-01-22 ENCOUNTER — Ambulatory Visit: Payer: Self-pay

## 2024-01-22 VITALS — BP 104/66 | HR 102 | Ht 60.0 in | Wt 169.2 lb

## 2024-01-22 DIAGNOSIS — U071 COVID-19: Secondary | ICD-10-CM | POA: Insufficient documentation

## 2024-01-22 DIAGNOSIS — R059 Cough, unspecified: Secondary | ICD-10-CM | POA: Diagnosis not present

## 2024-01-22 MED ORDER — NIRMATRELVIR/RITONAVIR (PAXLOVID)TABLET: 3.0000 | ORAL_TABLET | Freq: Two times a day (BID) | ORAL | 0 refills | Status: AC

## 2024-01-22 NOTE — Assessment & Plan Note (Signed)
 COVID test was positive during ER visit yesterday Started Paxlovid  - advised to contact her OB/GYN Robitussin as needed for cough Advised to maintain adequate hydration and eat at regular intervals Advised to take pyridoxine 25 mg as needed for nausea

## 2024-01-22 NOTE — Telephone Encounter (Signed)
 FYI Only or Action Required?: FYI only for provider: appointment scheduled on 01/22/24.  Patient was last seen in primary care on 05/31/2023 by Melvenia Manus BRAVO, MD.  Called Nurse Triage reporting Cough.  Symptoms began several days ago.  Interventions attempted: OTC medications: Tylenol .  Symptoms are: unchanged.  Triage Disposition: See Physician Within 24 Hours  Patient/caregiver understands and will follow disposition?: Yes  Reason for Disposition  [1] Continuous (nonstop) coughing interferes with work or school AND [2] no improvement using cough treatment per Care Advice  Answer Assessment - Initial Assessment Questions Pt [redacted] weeks pregnant. Went to ED yesterday, left due to being there for 5 hours. Taken Tylenol   1. ONSET: When did the cough begin?      3 days ago  2. SEVERITY: How bad is the cough today?      Worsening  3. SPUTUM: Describe the color of your sputum (e.g., none, dry cough; clear, white, yellow, green)     Dry cough  4. HEMOPTYSIS: Are you coughing up any blood? If Yes, ask: How much? (e.g., flecks, streaks, tablespoons, etc.)     Denies  5. DIFFICULTY BREATHING: Are you having difficulty breathing? If Yes, ask: How bad is it? (e.g., mild, moderate, severe)      When laying down at night some mild SOB  6. FEVER: Do you have a fever? If Yes, ask: What is your temperature, how was it measured, and when did it start?     Yes on Saturday and Sunday, but not yesterday. Has not checked today  7. CARDIAC HISTORY: Do you have any history of heart disease? (e.g., heart attack, congestive heart failure)      Denies  8. LUNG HISTORY: Do you have any history of lung disease?  (e.g., pulmonary embolus, asthma, emphysema)     Hx of asthma   9. OTHER SYMPTOMS: Do you have any other symptoms? (e.g., runny nose, wheezing, chest pain)       Chest pain, like heart burn, headaches, sneezing, runny nose  Protocols used: Cough - Acute  Productive-A-AH  Copied from CRM #[id number]. Topic: Clinical - Red Word Triage >> Jan 22, 2024  8:31 AM Tiffini S wrote: Red Word that prompted transfer to Nurse Triage: Patient is [redacted] weeks pregnant- she have a  cough and sore throat that started on 01/19/24- went to the ED and waited for 5 hours, they did a EKG and a flu test- she never got the results  Chest pains and fever on Saturday and Sunday
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
## 2024-01-22 NOTE — Telephone Encounter (Signed)
 RTC to pt concerning recent ER visit with +COVID and elevated D-dimer result. Pt left prior to discharge instructions and results. Pt was then seen this AM by PCP and was cleared to go home. Given RX Paxlovid . Dr. LOIS Moats OB/GYN was consulted regarding ER course and elevated D-dimer. She advised that D-dimer can be elevated in pregnancy. She advised that pt should be fine to stay at home as long as she does not become very SOB. At the time of my call, pt has now been instructed by ER RN to return to the hospital for CT scan to R/U PE and is checking in. Pt advised of above and RN will sent safe meds list for her to have assuming she will be discharged once PE is ruled out. Reassured pt that her abdomen can be shielded so that she can safely receive a CT scan. Pt verbalized understanding.

## 2024-01-22 NOTE — Telephone Encounter (Signed)
Noted patient scheduled

## 2024-01-22 NOTE — Patient Instructions (Addendum)
 Please start taking Paxlovid  as prescribed.  Please take Pyridoxine 25 mg as needed for nausea.  Please take Robitussin as needed for cough.

## 2024-01-28 ENCOUNTER — Encounter: Payer: Self-pay | Admitting: Obstetrics and Gynecology

## 2024-01-28 ENCOUNTER — Ambulatory Visit: Payer: Self-pay | Admitting: Obstetrics and Gynecology

## 2024-01-28 VITALS — BP 109/69 | HR 101 | Wt 170.8 lb

## 2024-01-28 DIAGNOSIS — Z3483 Encounter for supervision of other normal pregnancy, third trimester: Secondary | ICD-10-CM

## 2024-01-28 DIAGNOSIS — Z3A32 32 weeks gestation of pregnancy: Secondary | ICD-10-CM | POA: Diagnosis not present

## 2024-01-28 DIAGNOSIS — Z3480 Encounter for supervision of other normal pregnancy, unspecified trimester: Secondary | ICD-10-CM

## 2024-01-28 LAB — POCT URINALYSIS DIPSTICK
Bilirubin, UA: NEGATIVE
Blood, UA: NEGATIVE
Glucose, UA: NEGATIVE
Ketones, UA: NEGATIVE
Leukocytes, UA: NEGATIVE
Nitrite, UA: NEGATIVE
Protein, UA: NEGATIVE
Spec Grav, UA: 1.01
Urobilinogen, UA: 0.2 U/dL
pH, UA: 6

## 2024-01-28 NOTE — Patient Instructions (Addendum)
 Magnesium oxide 400 mg nightly Excedrine tension

## 2024-01-28 NOTE — Progress Notes (Signed)
 "  PRENATAL VISIT NOTE  Subjective:  Lori Briggs is a 24 y.o. G1P0000 at [redacted]w[redacted]d being seen today for ongoing prenatal care.  She is currently monitored for the following issues for this low-risk pregnancy and has Asthma, mild intermittent; Insulin resistance; Hyperinsulinemia; Acanthosis nigricans, acquired; Female hirsutism; Goiter; Scoliosis; History of prediabetes; CAP (community acquired pneumonia); Supervision of other normal pregnancy, antepartum; Alpha thalassemia silent carrier; and COVID-19 virus infection on their problem list.  Patient reports dx with covid last week, symptoms improving but continued low energy   Contractions: Not present. Vag. Bleeding: None.  Movement: Present. Denies leaking of fluid.   The following portions of the patient's history were reviewed and updated as appropriate: allergies, current medications, past family history, past medical history, past social history, past surgical history and problem list.   Objective:   Vitals:   01/28/24 1126  BP: 109/69  Pulse: (!) 101  Weight: 170 lb 12.8 oz (77.5 kg)    Fetal Status:  Fetal Heart Rate (bpm): 136   Movement: Present    General: Alert, oriented and cooperative. Patient is in no acute distress.  Skin: Skin is warm and dry. No rash noted.   Cardiovascular: Normal heart rate noted  Respiratory: Normal respiratory effort, no problems with respiration noted  Abdomen: Soft, gravid, appropriate for gestational age.  Pain/Pressure: Present     Pelvic: Cervical exam deferred        Extremities: Normal range of motion.  Edema: Trace  Mental Status: Normal mood and affect. Normal behavior. Normal judgment and thought content.      01/22/2024   10:25 AM 09/05/2023   11:15 AM 08/07/2023    1:46 PM  Depression screen PHQ 2/9  Decreased Interest 0 1 0  Down, Depressed, Hopeless 0 0 0  PHQ - 2 Score 0 1 0  Altered sleeping 0 2 0  Tired, decreased energy 0 2 0  Change in appetite 0 0 0  Feeling bad or  failure about yourself  0 0 0  Trouble concentrating 0 0 0  Moving slowly or fidgety/restless 0 0 0  Suicidal thoughts 0 0 0  PHQ-9 Score 0 5  0   Difficult doing work/chores Not difficult at all       Data saved with a previous flowsheet row definition        01/22/2024   10:25 AM 09/05/2023   11:16 AM 08/07/2023    1:47 PM 05/31/2023    9:23 AM  GAD 7 : Generalized Anxiety Score  Nervous, Anxious, on Edge 0 1 1 0  Control/stop worrying 0 1 1 0  Worry too much - different things 0 0 1 0  Trouble relaxing 0 0 0 0  Restless 0 0 0 0  Easily annoyed or irritable 0 2 2 0  Afraid - awful might happen 0 1 0 0  Total GAD 7 Score 0 5 5 0  Anxiety Difficulty Not difficult at all   Not difficult at all    Assessment and Plan:  Pregnancy: G1P0000 at [redacted]w[redacted]d 1. Supervision of other normal pregnancy, antepartum (Primary) BP and FHR normal Doing well, feeling regular movement   - POCT Urinalysis Dipstick  2. [redacted] weeks gestation of pregnancy Continue hydration and rest Supportive measures for headaches in pregnancy, discussed excedrine tension, mag oxide nightly  Continue monitoring BP  Continue oral iron  - POCT Urinalysis Dipstick  Preterm labor symptoms and general obstetric precautions including but not limited to vaginal bleeding,  contractions, leaking of fluid and fetal movement were reviewed in detail with the patient. Please refer to After Visit Summary for other counseling recommendations.    Future Appointments  Date Time Provider Department Center  02/11/2024  2:50 PM Constant, Winton, MD [facility] None    Nidia Daring, FNP  "

## 2024-01-28 NOTE — Progress Notes (Signed)
 Pt went ED 01/19/24 for SOB; was told she had Covid.   Pt reports headaches her whole life, but have gotten worse this past week. States she gets dizzy and lightheaded also. Reports blurry vision also.  Denies increased HR.   Declines female student.

## 2024-02-11 ENCOUNTER — Encounter: Payer: Self-pay | Admitting: Obstetrics

## 2024-02-11 ENCOUNTER — Encounter: Payer: Self-pay | Admitting: Obstetrics and Gynecology

## 2024-02-11 ENCOUNTER — Telehealth: Admitting: Obstetrics

## 2024-02-11 DIAGNOSIS — D563 Thalassemia minor: Secondary | ICD-10-CM

## 2024-02-11 DIAGNOSIS — L83 Acanthosis nigricans: Secondary | ICD-10-CM

## 2024-02-11 DIAGNOSIS — E88819 Insulin resistance, unspecified: Secondary | ICD-10-CM

## 2024-02-11 DIAGNOSIS — Z87898 Personal history of other specified conditions: Secondary | ICD-10-CM

## 2024-02-11 DIAGNOSIS — E161 Other hypoglycemia: Secondary | ICD-10-CM

## 2024-02-11 DIAGNOSIS — M4180 Other forms of scoliosis, site unspecified: Secondary | ICD-10-CM

## 2024-02-11 DIAGNOSIS — Z3480 Encounter for supervision of other normal pregnancy, unspecified trimester: Secondary | ICD-10-CM

## 2024-02-11 DIAGNOSIS — L680 Hirsutism: Secondary | ICD-10-CM

## 2024-02-11 NOTE — Progress Notes (Signed)
 Subjective:  Lori Briggs is a 24 y.o. G1P0000 at [redacted]w[redacted]d being seen today for ongoing prenatal care.  She is currently monitored for the following issues for this low-risk pregnancy and has Asthma, mild intermittent; Insulin resistance; Hyperinsulinemia; Acanthosis nigricans, acquired; Female hirsutism; Goiter; Scoliosis; History of prediabetes; CAP (community acquired pneumonia); Supervision of other normal pregnancy, antepartum; Alpha thalassemia silent carrier; and COVID-19 virus infection on their problem list.  Patient reports heartburn.   .  .   . Denies leaking of fluid.   The following portions of the patient's history were reviewed and updated as appropriate: allergies, current medications, past family history, past medical history, past social history, past surgical history and problem list. Problem list updated.  Objective:  There were no vitals filed for this visit.  Fetal Status:           General:  Alert, oriented and cooperative. Patient is in no acute distress.  Skin: Skin is warm and dry. No rash noted.   Cardiovascular: Normal heart rate noted  Respiratory: Normal respiratory effort, no problems with respiration noted  Abdomen: Soft, gravid, appropriate for gestational age.       Pelvic:  Cervical exam deferred        Extremities: Normal range of motion.     Mental Status: Normal mood and affect. Normal behavior. Normal judgment and thought content.   Urinalysis:      Assessment and Plan:  Pregnancy: G1P0000 at [redacted]w[redacted]d  1. Supervision of other normal pregnancy, antepartum (Primary)  2. Alpha thalassemia silent carrier  3. History of prediabetes  4. Insulin resistance  5. Hyperinsulinemia  6. Acanthosis nigricans, acquired  7. Female hirsutism  8. Other form of scoliosis, unspecified spinal region   Preterm labor symptoms and general obstetric precautions including but not limited to vaginal bleeding, contractions, leaking of fluid and fetal movement were  reviewed in detail with the patient. Please refer to After Visit Summary for other counseling recommendations.   Return in about 2 weeks (around 02/25/2024) for ROB.   Rudy Carlin DELENA, MD 02/11/2024

## 2024-02-20 ENCOUNTER — Ambulatory Visit: Payer: Self-pay | Admitting: Nurse Practitioner

## 2024-02-25 ENCOUNTER — Encounter: Payer: Self-pay | Admitting: Family Medicine

## 2024-03-24 ENCOUNTER — Inpatient Hospital Stay (HOSPITAL_COMMUNITY): Admit: 2024-03-24
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
